# Patient Record
Sex: Male | Born: 1995 | Race: Black or African American | Hispanic: No | Marital: Single | Smoking: Never smoker
Health system: Southern US, Community
[De-identification: ages and names within clinical notes are randomized; demographics above are authoritative.]

## PROBLEM LIST (undated history)

## (undated) ENCOUNTER — Emergency Department (HOSPITAL_COMMUNITY): Admission: EM | Payer: Managed Care, Other (non HMO) | Source: Home / Self Care

## (undated) ENCOUNTER — Ambulatory Visit (HOSPITAL_COMMUNITY): Admission: EM | Discharge status: Absent without leave | Payer: Managed Care, Other (non HMO)

---

## 2014-08-18 ENCOUNTER — Encounter (HOSPITAL_COMMUNITY): Payer: Self-pay | Admitting: Emergency Medicine

## 2014-08-18 ENCOUNTER — Emergency Department (INDEPENDENT_AMBULATORY_CARE_PROVIDER_SITE_OTHER)
Admission: EM | Admit: 2014-08-18 | Discharge: 2014-08-18 | Disposition: A | Discharge status: Home / Self Care | Payer: Managed Care, Other (non HMO) | Source: Home / Self Care | Attending: Family Medicine | Admitting: Family Medicine

## 2014-08-18 DIAGNOSIS — F419 Anxiety disorder, unspecified: Secondary | ICD-10-CM

## 2014-08-18 DIAGNOSIS — F450 Somatization disorder: Secondary | ICD-10-CM | POA: Diagnosis not present

## 2014-08-18 DIAGNOSIS — F418 Other specified anxiety disorders: Secondary | ICD-10-CM | POA: Diagnosis not present

## 2014-08-18 MED ORDER — AMITRIPTYLINE HCL 25 MG PO TABS: 25.0000 mg | ORAL_TABLET | Freq: Every day | ORAL | Status: DC

## 2014-08-18 NOTE — ED Provider Notes (Signed)
CSN: [id number]     Arrival date & time 08/18/14  1928 History   First MD Initiated Contact with Patient 08/18/14 2006     Chief Complaint  Patient presents with  . 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 He states that he has poor concentration, difficult to stay on task and associated physical symptoms such as numbness in the feet and hands, perception of shortness of breath and rapid heart rate in a slow heart rate. Denies chest pain. Has no cardiopulmonary or cardiovascular history.   History reviewed. No pertinent past medical history. History reviewed. No pertinent past surgical history. History reviewed. No pertinent family history. History  Substance Use Topics  . Smoking status: Never Smoker   . Smokeless tobacco: Not on file  . Alcohol Use: No    Review of Systems  Constitutional: Positive for activity change. Negative for fever and fatigue.  HENT: Negative.   Respiratory: Negative for cough.   Cardiovascular: Positive for palpitations.  Gastrointestinal: Negative.   Genitourinary: Negative.   Skin: Negative.   Neurological: Positive for numbness. Negative for dizziness, seizures, facial asymmetry, speech difficulty and headaches.  Psychiatric/Behavioral: Positive for sleep disturbance and decreased concentration. Negative for suicidal ideas. The patient is nervous/anxious.   All other systems reviewed and are negative.   Allergies  Review of patient's allergies indicates no known allergies.  Home Medications   Prior to Admission medications   Medication Sig Start Date End Date Taking? Authorizing Provider  amitriptyline (ELAVIL) 25 MG tablet Take 1 tablet (25 mg total) by mouth at bedtime. 08/18/14   Hayden Rasmussenavid Veronda Gabor, NP   BP 132/88 mmHg  Pulse 80  Temp(Src) 98.9 F (37.2 C) (Oral)  Resp 14  SpO2 99% Physical Exam  Constitutional:  He is oriented to person, place, and time. He appears well-developed and well-nourished. No distress.  Eyes: EOM are normal. Pupils are equal, round, and reactive to light.  Neck: Normal range of motion. Neck supple.  Cardiovascular: Normal rate, regular rhythm, normal heart sounds and intact distal pulses.   Pulmonary/Chest: Effort normal and breath sounds normal.  Musculoskeletal: Normal range of motion. He exhibits no edema.  Neurological: He is alert and oriented to person, place, and time.  Skin: Skin is warm and dry.  Psychiatric: His speech is normal and behavior is normal. His mood appears anxious. Cognition and memory are normal. Cognition and memory are not impaired.  Nursing note and vitals reviewed.   ED Course  Procedures (including critical care time) Labs Review Labs Reviewed - No data to display  Imaging Review No results found.  EKG: Normal sinus rhythm. No ectopy. No ST-T changes. Normal axis. MDM   1. Situational anxiety   2. Anxiety with somatization    Amitriptyline 25 mg one half tablet daily at bedtime for the first 2-3 nights. Then if needed go up to 1 tablet daily at bedtime. Continue see the school counselor. If medication needs changing or additional medication is needed recommend seeing a primary care provider in the area. Patient was advised we would not be able to prescribe benzodiazepines or SSRIs. This requires close follow-up that we are unable to provide here. Patient states he understands.    Hayden Rasmussenavid Annelise Mccoy, NP 08/18/14 2053

## 2014-08-18 NOTE — ED Notes (Signed)
Reports feeling like heart is racing and at time moving slow.  Tingling in chest all over.  SOB.  Tightness in chest.  No pain.    Denies cardiac hx.

## 2014-10-11 ENCOUNTER — Emergency Department (INDEPENDENT_AMBULATORY_CARE_PROVIDER_SITE_OTHER)
Admission: EM | Admit: 2014-10-11 | Discharge: 2014-10-11 | Disposition: A | Discharge status: Nursing Home (Includes ICF) | Payer: Managed Care, Other (non HMO) | Source: Home / Self Care | Attending: Family Medicine | Admitting: Family Medicine

## 2014-10-11 ENCOUNTER — Other Ambulatory Visit (HOSPITAL_COMMUNITY)
Admission: RE | Admit: 2014-10-11 | Payer: Managed Care, Other (non HMO) | Source: Ambulatory Visit | Admitting: Family Medicine

## 2014-10-11 ENCOUNTER — Encounter (HOSPITAL_COMMUNITY): Payer: Self-pay | Admitting: Emergency Medicine

## 2014-10-11 ENCOUNTER — Other Ambulatory Visit (HOSPITAL_COMMUNITY)
Admission: RE | Admit: 2014-10-11 | Discharge: 2014-10-11 | Disposition: A | Discharge status: Home / Self Care | Payer: Managed Care, Other (non HMO) | Source: Ambulatory Visit | Attending: Family Medicine | Admitting: Family Medicine

## 2014-10-11 ENCOUNTER — Emergency Department (HOSPITAL_COMMUNITY)
Admission: EM | Admit: 2014-10-11 | Discharge: 2014-10-12 | Disposition: A | Discharge status: Home / Self Care | Payer: Managed Care, Other (non HMO) | Attending: Emergency Medicine | Admitting: Emergency Medicine

## 2014-10-11 ENCOUNTER — Encounter (HOSPITAL_COMMUNITY): Payer: Self-pay | Admitting: *Deleted

## 2014-10-11 DIAGNOSIS — R198 Other specified symptoms and signs involving the digestive system and abdomen: Secondary | ICD-10-CM | POA: Diagnosis not present

## 2014-10-11 DIAGNOSIS — Z113 Encounter for screening for infections with a predominantly sexual mode of transmission: Secondary | ICD-10-CM | POA: Insufficient documentation

## 2014-10-11 DIAGNOSIS — Z880 Allergy status to penicillin: Secondary | ICD-10-CM | POA: Insufficient documentation

## 2014-10-11 DIAGNOSIS — K6289 Other specified diseases of anus and rectum: Secondary | ICD-10-CM | POA: Diagnosis not present

## 2014-10-11 DIAGNOSIS — K611 Rectal abscess: Secondary | ICD-10-CM | POA: Diagnosis not present

## 2014-10-11 LAB — OCCULT BLOOD, POC DEVICE: Fecal Occult Bld: POSITIVE — AB

## 2014-10-11 NOTE — ED Provider Notes (Addendum)
CSN: [id number]     Arrival date & time 10/11/14  1943 History   First MD Initiated Contact with Patient 10/11/14 2030       Chief Complaint  Patient presents with  . Rectal Pain  . SEXUALLY TRANSMITTED DISEASE    testing   (Consider location/radiation/quality/duration/timing/severity/associated sxs/prior Treatment) HPI     19 year old male presents with 2 complaints. The first is a likely be screened for STDs. He gets tested every 3 months. He has sex with men only, he usually uses a condom but not all the time. Denies any dysuria, testicle pain, or penile discharge.  His second complaint is rectal pain. He is concerned that this may be due to constipation. For one week he has gradually increasing rectal pain. The pain is there all the time but he gets worse with having a bowel movement. He has had only small hard bowel movements for the past week. He occasionally gets some bleeding onto the toilet paper as well. He admits to some mild headache and some possible subjective fever and chills as well.  History reviewed. No pertinent past medical history. History reviewed. No pertinent past surgical history. History reviewed. No pertinent family history. History  Substance Use Topics  . Smoking status: Never Smoker   . Smokeless tobacco: Not on file  . Alcohol Use: No    Review of Systems  Constitutional: Positive for chills.  Gastrointestinal: Positive for constipation and rectal pain.  Genitourinary: Negative for dysuria, urgency, frequency and testicular pain.  All other systems reviewed and are negative.   Allergies  Review of patient's allergies indicates no known allergies.  Home Medications   Prior to Admission medications   Medication Sig Start Date End Date Taking? Authorizing Provider  amitriptyline (ELAVIL) 25 MG tablet Take 1 tablet (25 mg total) by mouth at bedtime. 08/18/14   Hayden Rasmussenavid Mabe, NP   BP 135/82 mmHg  Pulse 75  Temp(Src) 98.4 F (36.9 C) (Oral)  Resp 12   SpO2 98% Physical Exam  Constitutional: He is oriented to person, place, and time. He appears well-developed and well-nourished. No distress.  HENT:  Head: Normocephalic.  Pulmonary/Chest: Effort normal. No respiratory distress.  Abdominal: Soft. There is no tenderness.  Genitourinary: Rectal exam shows mass and tenderness (1 cm inside the anal verge on the posterior anus between the anus and the coccyx, there is a palpable fluctuant fullness that is exquisitely tender). Rectal exam shows no external hemorrhoid and no internal hemorrhoid. Guaiac positive stool.  Neurological: He is alert and oriented to person, place, and time. Coordination normal.  Skin: Skin is warm and dry. No rash noted. He is not diaphoretic.  Psychiatric: He has a normal mood and affect. Judgment normal.  Nursing note and vitals reviewed.   ED Course  Procedures (including critical care time) Labs Review Labs Reviewed  OCCULT BLOOD, POC DEVICE - Abnormal; Notable for the following:    Fecal Occult Bld POSITIVE (*)    All other components within normal limits  RPR  HIV ANTIBODY (ROUTINE TESTING)  URINE CYTOLOGY ANCILLARY ONLY    Imaging Review No results found.   MDM   1. Rectal pain   2. Rectal mass   3. Screen for STD (sexually transmitted disease)    I called the emergency department and spoke with one of the physician assistants therapy recommended we go ahead and send this patient over to get a CT scan to rule out an initial rectal abscess. He'll be transferred via shuttle.  He also requested STD screening, we have sent urine cytology for GC and chlamydia and trichomonas and also sent serum HIV and RPR.    Graylon Good, PA-C 10/11/14 2059   RPR reactive.  He has been called to come back in for treatment - 2.4 million units IM Bicillin   Graylon Good, PA-C 10/19/14 [phone]

## 2014-10-11 NOTE — ED Notes (Signed)
Pt will be sent to ED via shuttle for possible rectal abscess.  Report was called to Wilford Sportsassie, RN First Nurse ED.

## 2014-10-11 NOTE — ED Notes (Signed)
Pt reports anal pain for one week.  He thinks it is from straining from being constipated.  Pt would also like to be test for STD's.  He was last tested in January.  Pt says he does use condoms and has a male partner.

## 2014-10-11 NOTE — ED Notes (Signed)
Pt sent here from urgent care for evaluation of possible peri-rectal abscess, c/o pain to area for the last few days, no distress noted

## 2014-10-11 NOTE — ED Provider Notes (Signed)
CSN: [id number]     Arrival date & time 10/11/14  2107 History   First MD Initiated Contact with Patient 10/11/14 2301     Chief Complaint  Patient presents with  . Abscess     (Consider location/radiation/quality/duration/timing/severity/associated sxs/prior Treatment) Patient is a 19 y.o. male presenting with abscess. The history is provided by the patient. No language interpreter was used.  Abscess  [name] is a 19 y.o black male who presents for new onset worsening rectal pain for the past week.  He states nothing makes it better.  It is worse with bowel movements. His pain is 10/10. He states he is homosexual and last had anal intercourse 3 weeks ago.  At that time he states he had no pain. He has had some bright blood streaked stool. He denies any history of STD's.  He denies any fever, chills, nausea, vomiting, abdominal pain, dysuria, hematuria, or urinary frequency. History reviewed. No pertinent past medical history. History reviewed. No pertinent past surgical history. History reviewed. No pertinent family history. History  Substance Use Topics  . Smoking status: Never Smoker   . Smokeless tobacco: Not on file  . Alcohol Use: No    Review of Systems  Gastrointestinal: Negative for diarrhea and abdominal distention.      Allergies  Amoxicillin and Penicillins  Home Medications   Prior to Admission medications   Medication Sig Start Date End Date Taking? Authorizing Provider  cetirizine (ZYRTEC) 10 MG tablet Take 10 mg by mouth daily as needed for allergies.   Yes Historical Provider, MD  ibuprofen (ADVIL,MOTRIN) 200 MG tablet Take 200 mg by mouth every 6 (six) hours as needed for mild pain.   Yes Historical Provider, MD  amitriptyline (ELAVIL) 25 MG tablet Take 1 tablet (25 mg total) by mouth at bedtime. Patient not taking: Reported on 10/11/2014 08/18/14   Hayden Rasmussenavid Mabe, NP  ciprofloxacin (CIPRO) 500 MG tablet Take 1 tablet (500 mg total) by mouth 2 (two) times daily.  One po bid x 10 days 10/12/14   Elwin MochaBlair Walden, MD  HYDROcodone-acetaminophen (NORCO/VICODIN) 5-325 MG per tablet Take 1 tablet by mouth every 6 (six) hours as needed for moderate pain. 10/12/14   Elwin MochaBlair Walden, MD  metroNIDAZOLE (FLAGYL) 500 MG tablet Take 1 tablet (500 mg total) by mouth 3 (three) times daily. One po bid x 7 days 10/12/14   Elwin MochaBlair Walden, MD   BP 134/77 mmHg  Pulse 81  Temp(Src) 98.1 F (36.7 C) (Oral)  Resp 20  Ht 5\' 10"  (1.778 m)  Wt 175 lb (79.379 kg)  BMI 25.11 kg/m2  SpO2 99% Physical Exam  Constitutional: He is oriented to person, place, and time. He appears well-developed and well-nourished.  Eyes: Conjunctivae are normal.  Cardiovascular: Normal rate, regular rhythm and normal heart sounds.   Pulmonary/Chest: Effort normal and breath sounds normal.  Abdominal: Soft. He exhibits no distension. There is no tenderness.  Genitourinary:  Rectal exam: Chaperone present.  Patient tender on rectal exam. I could not feel fluctuance.  No blood, no hemorrhoids, and no anal fissure seen on exam.   Musculoskeletal: Normal range of motion.  Neurological: He is alert and oriented to person, place, and time.  Skin: Skin is warm and dry.  Nursing note and vitals reviewed.   ED Course  Procedures (including critical care time) Labs Review Labs Reviewed  CBC WITH DIFFERENTIAL/PLATELET - Abnormal; Notable for the following:    WBC 15.1 (*)    Neutrophils Relative % 80 (*)  Neutro Abs 12.0 (*)    All other components within normal limits  BASIC METABOLIC PANEL  POC OCCULT BLOOD, ED    Imaging Review Ct Abdomen Pelvis W Contrast  10/12/2014   CLINICAL DATA:  Anal pain for 1 week, concerning for abscess.  EXAM: CT ABDOMEN AND PELVIS WITH CONTRAST  TECHNIQUE: Multidetector CT imaging of the abdomen and pelvis was performed using the standard protocol following bolus administration of intravenous contrast.  CONTRAST:  100 cc Omnipaque 300 intravenous  COMPARISON:  None.   FINDINGS: BODY WALL: No contributory findings.  LOWER CHEST: No contributory findings.  ABDOMEN/PELVIS:  Liver: No focal abnormality.  Biliary: No evidence of biliary obstruction or stone.  Pancreas: Unremarkable.  Spleen: Unremarkable.  Adrenals: Unremarkable.  Kidneys and ureters: No hydronephrosis or stone.  Bladder: Unremarkable.  Reproductive: No pathologic findings.  Bowel: 11 mm peripherally enhancing fluid collection at the anal rectal junction. No visible fistula to the perineum.  Retroperitoneum: No mass or adenopathy.  Peritoneum: No ascites or pneumoperitoneum.  Vascular: No acute abnormality.  OSSEOUS: No acute abnormalities.  IMPRESSION: 11 mm posterior anorectal abscess.   Electronically Signed   By: Marnee Spring M.D.   On: 10/12/2014 03:18     EKG Interpretation None      MDM   Final diagnoses:  Perirectal abscess  Patient presents for rectal pain x 1 week. On exam he is tender on rectal exam but no fluctuance or bleeding.   He has a WBC of 15.1. Negative fecal occult.  Normal BMP. He is currently afebrile and his vitals are stable. I suspect the patient has a rectal abscess.  The CT results are pending.  I have discussed this patient with and transferred care of this patient to Dr. Elwin Mocha.       Catha Gosselin, PA-C 10/13/14 1545  Nelva Nay, MD 10/14/14 [phone]

## 2014-10-12 ENCOUNTER — Encounter (HOSPITAL_COMMUNITY): Payer: Self-pay

## 2014-10-12 ENCOUNTER — Emergency Department (HOSPITAL_COMMUNITY): Payer: Managed Care, Other (non HMO)

## 2014-10-12 LAB — CBC WITH DIFFERENTIAL/PLATELET
BASOS PCT: 0 % (ref 0–1)
Basophils Absolute: 0 10*3/uL (ref 0.0–0.1)
Eosinophils Absolute: 0.3 10*3/uL (ref 0.0–0.7)
Eosinophils Relative: 2 % (ref 0–5)
HEMATOCRIT: 43.1 % (ref 39.0–52.0)
Hemoglobin: 14.4 g/dL (ref 13.0–17.0)
LYMPHS PCT: 13 % (ref 12–46)
Lymphs Abs: 2 10*3/uL (ref 0.7–4.0)
MCH: 30.4 pg (ref 26.0–34.0)
MCHC: 33.4 g/dL (ref 30.0–36.0)
MCV: 91.1 fL (ref 78.0–100.0)
MONO ABS: 0.8 10*3/uL (ref 0.1–1.0)
MONOS PCT: 5 % (ref 3–12)
Neutro Abs: 12 10*3/uL — ABNORMAL HIGH (ref 1.7–7.7)
Neutrophils Relative %: 80 % — ABNORMAL HIGH (ref 43–77)
Platelets: 205 10*3/uL (ref 150–400)
RBC: 4.73 MIL/uL (ref 4.22–5.81)
RDW: 12.3 % (ref 11.5–15.5)
WBC: 15.1 10*3/uL — ABNORMAL HIGH (ref 4.0–10.5)

## 2014-10-12 LAB — BASIC METABOLIC PANEL
Anion gap: 6 (ref 5–15)
BUN: 6 mg/dL (ref 6–23)
CHLORIDE: 105 mmol/L (ref 96–112)
CO2: 29 mmol/L (ref 19–32)
Calcium: 9.4 mg/dL (ref 8.4–10.5)
Creatinine, Ser: 1.02 mg/dL (ref 0.50–1.35)
GFR calc Af Amer: >90 mL/min (ref >90)
GFR calc non Af Amer: >90 mL/min (ref >90)
GLUCOSE: 88 mg/dL (ref 70–99)
Potassium: 3.5 mmol/L (ref 3.5–5.1)
SODIUM: 140 mmol/L (ref 135–145)

## 2014-10-12 LAB — URINE CYTOLOGY ANCILLARY ONLY
Chlamydia: NEGATIVE
NEISSERIA GONORRHEA: NEGATIVE
Trichomonas: NEGATIVE

## 2014-10-12 LAB — HIV ANTIBODY (ROUTINE TESTING W REFLEX): HIV SCREEN 4TH GENERATION: NONREACTIVE

## 2014-10-12 LAB — POC OCCULT BLOOD, ED: Fecal Occult Bld: NEGATIVE

## 2014-10-12 MED ORDER — METRONIDAZOLE 500 MG PO TABS
500.0000 mg | ORAL_TABLET | Freq: Once | ORAL | Status: AC
  Administered 2014-10-12: 500 mg via ORAL
  Filled 2014-10-12: qty 1

## 2014-10-12 MED ORDER — METRONIDAZOLE 500 MG PO TABS: 500.0000 mg | ORAL_TABLET | Freq: Three times a day (TID) | ORAL | Status: DC

## 2014-10-12 MED ORDER — IOHEXOL 300 MG/ML  SOLN
25.0000 mL | Freq: Once | INTRAMUSCULAR | Status: AC | PRN
  Administered 2014-10-12: 25 mL via ORAL

## 2014-10-12 MED ORDER — IOHEXOL 300 MG/ML  SOLN
100.0000 mL | Freq: Once | INTRAMUSCULAR | Status: AC | PRN
  Administered 2014-10-12: 100 mL via INTRAVENOUS

## 2014-10-12 MED ORDER — MORPHINE SULFATE 4 MG/ML IJ SOLN
INTRAMUSCULAR | Status: AC
  Filled 2014-10-12: qty 1

## 2014-10-12 MED ORDER — CIPROFLOXACIN HCL 500 MG PO TABS: 500.0000 mg | ORAL_TABLET | Freq: Two times a day (BID) | ORAL | Status: DC

## 2014-10-12 MED ORDER — HYDROCODONE-ACETAMINOPHEN 5-325 MG PO TABS
2.0000 | ORAL_TABLET | Freq: Once | ORAL | Status: AC
Start: 2014-10-12 — End: 2014-10-12
  Administered 2014-10-12: 2 via ORAL
  Filled 2014-10-12: qty 2

## 2014-10-12 MED ORDER — HYDROCODONE-ACETAMINOPHEN 5-325 MG PO TABS: 1.0000 | ORAL_TABLET | Freq: Four times a day (QID) | ORAL | Status: DC | PRN

## 2014-10-12 MED ORDER — CIPROFLOXACIN HCL 500 MG PO TABS
500.0000 mg | ORAL_TABLET | Freq: Once | ORAL | Status: AC
  Administered 2014-10-12: 500 mg via ORAL
  Filled 2014-10-12: qty 1

## 2014-10-12 NOTE — Discharge Instructions (Signed)
Peri-Rectal Abscess  Your caregiver has diagnosed you as having a peri-rectal abscess. This is an infected area near the rectum that is filled with pus. If the abscess is near the surface of the skin, your caregiver may open (incise) the area and drain the pus.  HOME CARE INSTRUCTIONS    If your abscess was opened up and drained. A small piece of gauze may be placed in the opening so that it can drain. Do not remove the gauze unless directed by your caregiver.   A loose dressing may be placed over the abscess site. Change the dressing as often as necessary to keep it clean and dry.   After the drain is removed, the area may be washed with a gentle antiseptic (soap) four times per day.   A warm sitz bath, warm packs or heating pad may be used for pain relief, taking care not to burn yourself.   Return for a wound check in 1 day or as directed.   An "inflatable doughnut" may be used for sitting with added comfort. These can be purchased at a drugstore or medical supply house.   To reduce pain and straining with bowel movements, eat a high fiber diet with plenty of fruits and vegetables. Use stool softeners as recommended by your caregiver. This is especially important if narcotic type pain medications were prescribed as these may cause marked constipation.   Only take over-the-counter or prescription medicines for pain, discomfort, or fever as directed by your caregiver.  SEEK IMMEDIATE MEDICAL CARE IF:    You have increasing pain that is not controlled by medication.   There is increased inflammation (redness), swelling, bleeding, or drainage from the area.   An oral temperature above 102 F (38.9 C) develops.   You develop chills or generalized malaise (feel lethargic or feel "washed out").   You develop any new symptoms (problems) you feel may be related to your present problem.  Document Released: 06/14/2000 Document Revised: 09/09/2011 Document Reviewed: 06/14/2008  [employer] Patient Information  2015 [employer]. This information is not intended to replace advice given to you by your health care provider. Make sure you discuss any questions you have with your health care provider.

## 2014-10-12 NOTE — ED Notes (Signed)
Patient states understanding of discharge instructions. He requested to ambulate to the lobby. His gait was assessed and he was steady. First dose of ABT was provided prior to discharge. Pain medication was provided and it was insured that patient had other means of transportation.

## 2014-10-12 NOTE — Progress Notes (Signed)
19 yr old male seen by EDP and d/c on 10/12/14 with rx fro flagyl.  EDRN CM transferred a call with [facility] staff member, Revonda Standardllison on the line to get clarification of administration of Flagyl.  ED RN CM spoke with Dr Kirtland BouchardK Ward to review order Determined order should be Flagyl 1 tab bid for 7 days with toatl of 14 tabs, no refills start date 10/12/14  ED RN [facility]

## 2014-10-12 NOTE — ED Provider Notes (Signed)
[facility] care [facility]. Patient awaiting ET for possible perirectal abscess. CT returned, reviewed by me. Shows perirectal/perianal abscess. So 11 mm. I examined the patient could not see any erythema or fluctuance on the exterior. I did not perform an internal rectal exam due to discomfort for the patient. I spoke with Dr.Tsuei concerning the patient. He recommended sitz baths and antibiotics and return to the ED in 2 days if not improving. He said symptoms under 2 cm, should be able to get good control with antibiotics.  1. Perirectal abscess   2. Rectal pain   3. Rectal pain   4. Rectal pain   5. Rectal pain    I have reviewed all labs and imaging and considered them in my medical decision making.  CT Abdomen Pelvis W Contrast (Final result) Result time: 10/12/14 03:18:48   Final result by Rad Results In Interface (10/12/14 03:18:48)   Narrative:   CLINICAL DATA: Anal pain for 1 week, concerning for abscess.  EXAM: CT ABDOMEN AND PELVIS WITH CONTRAST  TECHNIQUE: Multidetector CT imaging of the abdomen and pelvis was performed using the standard protocol following bolus administration of intravenous contrast.  CONTRAST: 100 cc Omnipaque 300 intravenous  COMPARISON: None.  FINDINGS: BODY WALL: No contributory findings.  LOWER CHEST: No contributory findings.  ABDOMEN/PELVIS:  Liver: No focal abnormality.  Biliary: No evidence of biliary obstruction or stone.  Pancreas: Unremarkable.  Spleen: Unremarkable.  Adrenals: Unremarkable.  Kidneys and ureters: No hydronephrosis or stone.  Bladder: Unremarkable.  Reproductive: No pathologic findings.  Bowel: 11 mm peripherally enhancing fluid collection at the anal rectal junction. No visible fistula to the perineum.  Retroperitoneum: No mass or adenopathy.  Peritoneum: No ascites or pneumoperitoneum.  Vascular: No acute abnormality.  OSSEOUS: No acute abnormalities.  IMPRESSION: 11 mm posterior  anorectal abscess.   Electronically Signed By: Marnee SpringJonathon Watts M.D. On: 10/12/2014 03:18        Elwin MochaBlair Remon Quinto, MD 10/12/14 [id number]

## 2014-10-13 LAB — RPR, QUANT+TP ABS (REFLEX)
Rapid Plasma Reagin, Quant: 1:4 {titer} — ABNORMAL HIGH
TREPONEMA PALLIDUM AB: POSITIVE — AB

## 2014-10-13 LAB — RPR: RPR Ser Ql: REACTIVE — AB

## 2014-10-17 ENCOUNTER — Telehealth (HOSPITAL_COMMUNITY): Payer: Self-pay | Admitting: *Deleted

## 2014-10-17 NOTE — ED Notes (Addendum)
GC/Chlamydia and Trich neg., HIV non-reactive, RPR reactive, quant 1:4, T. Pallidum positive.  I called pt. Pt. verified x 2 and given results.  Pt. toldhe needs to come back tomorrow for treatment.  Pt. instructed to notify his partner,no sex for 1 week after treatment and to practice safe sex.  He [facility].  Pt. Voiced understanding. 10/17/2014 I called pt. back.  He said he went to the ED and got some pills. I asked what medicine and he said Metronidazole and Ciprofloxacin.  I told him that does not treat Syphilis.  I told him the treatment is 2 shots of Penicillin. He said he is allergic- it causes a rash.  Discussed with Dr. Piedad ClimesHonig. She said the alternative treatment is Doxycycline BID x 14 days or desensitization which we do not do here.   She said to call the Health Dept. to verify treatment options.  [facility].  Dr. Piedad ClimesHonig notified and e-prescribed it to pt.'s pharmacy. I called pt. back and notified of treatment plan. He said he is not good at taking pills and wants to do the other. I told him it would be more expensive and take longer because he would have to go to the ED...-explained the process to him.  He said he will try the pills. Pt. told where to pick up the Rx. Vassie MoselleYork, Sefora Tietje M 10/24/2014 DHHS form completed and [facility]. 10/24/2014

## 2014-10-24 MED ORDER — DOXYCYCLINE HYCLATE 100 MG PO CAPS
100.0000 mg | ORAL_CAPSULE | Freq: Two times a day (BID) | ORAL | Status: DC
Start: 2014-10-24 — End: 2015-07-12

## 2015-03-07 ENCOUNTER — Emergency Department (HOSPITAL_COMMUNITY)
Admission: EM | Admit: 2015-03-07 | Discharge: 2015-03-07 | Discharge status: Against Medical Advice | Payer: Managed Care, Other (non HMO)

## 2015-03-08 ENCOUNTER — Emergency Department (HOSPITAL_COMMUNITY)
Admission: EM | Admit: 2015-03-08 | Discharge: 2015-03-08 | Disposition: A | Discharge status: Home / Self Care | Payer: No Typology Code available for payment source | Attending: Emergency Medicine | Admitting: Emergency Medicine

## 2015-03-08 ENCOUNTER — Emergency Department (HOSPITAL_COMMUNITY): Payer: No Typology Code available for payment source

## 2015-03-08 ENCOUNTER — Encounter (HOSPITAL_COMMUNITY): Payer: Self-pay | Admitting: Emergency Medicine

## 2015-03-08 DIAGNOSIS — Z880 Allergy status to penicillin: Secondary | ICD-10-CM | POA: Diagnosis not present

## 2015-03-08 DIAGNOSIS — K611 Rectal abscess: Secondary | ICD-10-CM | POA: Insufficient documentation

## 2015-03-08 DIAGNOSIS — Z79899 Other long term (current) drug therapy: Secondary | ICD-10-CM | POA: Diagnosis not present

## 2015-03-08 DIAGNOSIS — Z792 Long term (current) use of antibiotics: Secondary | ICD-10-CM | POA: Insufficient documentation

## 2015-03-08 LAB — URINALYSIS, ROUTINE W REFLEX MICROSCOPIC
Bilirubin Urine: NEGATIVE
Glucose, UA: NEGATIVE mg/dL
HGB URINE DIPSTICK: NEGATIVE
KETONES UR: 15 mg/dL — AB
Leukocytes, UA: NEGATIVE
Nitrite: NEGATIVE
Protein, ur: NEGATIVE mg/dL
SPECIFIC GRAVITY, URINE: 1.015 (ref 1.005–1.030)
UROBILINOGEN UA: 1 mg/dL (ref 0.0–1.0)
pH: 7.5 (ref 5.0–8.0)

## 2015-03-08 LAB — CBC WITH DIFFERENTIAL/PLATELET
BASOS PCT: 0 % (ref 0–1)
Basophils Absolute: 0 10*3/uL (ref 0.0–0.1)
EOS ABS: 0.3 10*3/uL (ref 0.0–0.7)
EOS PCT: 2 % (ref 0–5)
HCT: 42.1 % (ref 39.0–52.0)
Hemoglobin: 13.6 g/dL (ref 13.0–17.0)
LYMPHS ABS: 1.8 10*3/uL (ref 0.7–4.0)
Lymphocytes Relative: 11 % — ABNORMAL LOW (ref 12–46)
MCH: 30.2 pg (ref 26.0–34.0)
MCHC: 32.3 g/dL (ref 30.0–36.0)
MCV: 93.6 fL (ref 78.0–100.0)
MONOS PCT: 4 % (ref 3–12)
Monocytes Absolute: 0.6 10*3/uL (ref 0.1–1.0)
Neutro Abs: 13.1 10*3/uL — ABNORMAL HIGH (ref 1.7–7.7)
Neutrophils Relative %: 83 % — ABNORMAL HIGH (ref 43–77)
PLATELETS: 186 10*3/uL (ref 150–400)
RBC: 4.5 MIL/uL (ref 4.22–5.81)
RDW: 12.6 % (ref 11.5–15.5)
WBC: 15.8 10*3/uL — ABNORMAL HIGH (ref 4.0–10.5)

## 2015-03-08 LAB — I-STAT CHEM 8, ED
BUN: 8 mg/dL (ref 6–20)
CALCIUM ION: 1.24 mmol/L — AB (ref 1.12–1.23)
Chloride: 104 mmol/L (ref 101–111)
Creatinine, Ser: 1 mg/dL (ref 0.61–1.24)
Glucose, Bld: 102 mg/dL — ABNORMAL HIGH (ref 65–99)
HCT: 45 % (ref 39.0–52.0)
HEMOGLOBIN: 15.3 g/dL (ref 13.0–17.0)
Potassium: 3.9 mmol/L (ref 3.5–5.1)
SODIUM: 142 mmol/L (ref 135–145)
TCO2: 26 mmol/L (ref 0–100)

## 2015-03-08 MED ORDER — CIPROFLOXACIN HCL 500 MG PO TABS: 500.0000 mg | ORAL_TABLET | Freq: Two times a day (BID) | ORAL | Status: DC

## 2015-03-08 MED ORDER — HYDROCODONE-ACETAMINOPHEN 5-325 MG PO TABS
2.0000 | ORAL_TABLET | Freq: Once | ORAL | Status: AC
  Administered 2015-03-08: 2 via ORAL
  Filled 2015-03-08: qty 2

## 2015-03-08 MED ORDER — METRONIDAZOLE 500 MG PO TABS
500.0000 mg | ORAL_TABLET | Freq: Once | ORAL | Status: AC
  Administered 2015-03-08: 500 mg via ORAL
  Filled 2015-03-08: qty 1

## 2015-03-08 MED ORDER — IOHEXOL 300 MG/ML  SOLN
50.0000 mL | Freq: Once | INTRAMUSCULAR | Status: DC | PRN
  Administered 2015-03-08: 50 mL via ORAL
  Filled 2015-03-08: qty 50

## 2015-03-08 MED ORDER — IOHEXOL 300 MG/ML  SOLN
100.0000 mL | Freq: Once | INTRAMUSCULAR | Status: AC | PRN
  Administered 2015-03-08: 100 mL via INTRAVENOUS

## 2015-03-08 MED ORDER — METRONIDAZOLE 500 MG PO TABS: 500.0000 mg | ORAL_TABLET | Freq: Two times a day (BID) | ORAL | Status: DC

## 2015-03-08 MED ORDER — CIPROFLOXACIN HCL 500 MG PO TABS
500.0000 mg | ORAL_TABLET | Freq: Once | ORAL | Status: AC
  Administered 2015-03-08: 500 mg via ORAL
  Filled 2015-03-08: qty 1

## 2015-03-08 MED ORDER — HYDROCODONE-ACETAMINOPHEN 5-325 MG PO TABS: 1.0000 | ORAL_TABLET | Freq: Four times a day (QID) | ORAL | Status: DC | PRN

## 2015-03-08 NOTE — ED Notes (Signed)
Presents with perirectal abscess, has had this issue before, noticed it getting worse 3 days ago.  VSS, states he had chills but is afebrile at triage.

## 2015-03-08 NOTE — Discharge Instructions (Signed)
Peri-Rectal Abscess  Your caregiver has diagnosed you as having a peri-rectal abscess. This is an infected area near the rectum that is filled with pus. If the abscess is near the surface of the skin, your caregiver may open (incise) the area and drain the pus.  HOME CARE INSTRUCTIONS    If your abscess was opened up and drained. A small piece of gauze may be placed in the opening so that it can drain. Do not remove the gauze unless directed by your caregiver.   A loose dressing may be placed over the abscess site. Change the dressing as often as necessary to keep it clean and dry.   After the drain is removed, the area may be washed with a gentle antiseptic (soap) four times per day.   A warm sitz bath, warm packs or heating pad may be used for pain relief, taking care not to burn yourself.   Return for a wound check in 1 day or as directed.   An "inflatable doughnut" may be used for sitting with added comfort. These can be purchased at a drugstore or medical supply house.   To reduce pain and straining with bowel movements, eat a high fiber diet with plenty of fruits and vegetables. Use stool softeners as recommended by your caregiver. This is especially important if narcotic type pain medications were prescribed as these may cause marked constipation.   Only take over-the-counter or prescription medicines for pain, discomfort, or fever as directed by your caregiver.  SEEK IMMEDIATE MEDICAL CARE IF:    You have increasing pain that is not controlled by medication.   There is increased inflammation (redness), swelling, bleeding, or drainage from the area.   An oral temperature above 102 F (38.9 C) develops.   You develop chills or generalized malaise (feel lethargic or feel "washed out").   You develop any new symptoms (problems) you feel may be related to your present problem.  Document Released: 06/14/2000 Document Revised: 09/09/2011 Document Reviewed: 06/14/2008  [employer] Patient Information  2015 [employer]. This information is not intended to replace advice given to you by your health care provider. Make sure you discuss any questions you have with your health care provider.

## 2015-03-08 NOTE — ED Provider Notes (Signed)
CSN: [id number]     Arrival date & time 03/08/15  1041 History   First MD Initiated Contact with Patient 03/08/15 1044     Chief Complaint  Patient presents with  . Abscess     (Consider location/radiation/quality/duration/timing/severity/associated sxs/prior Treatment) HPI  19 year old male presents for evaluation of rectal pain. Patient states that he was diagnosed with a perirectal abscess 5 months ago but it was too small for size and drainage and he was discharge with oral antibiotic. He has had increasing worsening pain to his rectum for the approximately 3 days. Pain worsening with bowel movement, walking and sitting. Pain medially improved using Tucks and Recti-Wipes.  He has not had a normal bowel movement for several days as a worsening his rectal pain. He reported having subjective chills and hot flash. Denies any bowel bladder changes, no rectal bleeding or rectal discharge. Reports some mild urinary hesitancy without urinary discomfort. Patient does admits that he is sexually active with same sex partners, and has had anal sex. He has had 2 different partners within the past 6 months. No history of UC or Crohn's disease and no history of HIV. Patient also denies any trauma. No complaints of testicular pain, or penile discharge.    History reviewed. No pertinent past medical history. History reviewed. No pertinent past surgical history. No family history on file. Social History  Substance Use Topics  . Smoking status: Never Smoker   . Smokeless tobacco: None  . Alcohol Use: No    Review of Systems  All other systems reviewed and are negative.     Allergies  Amoxicillin and Penicillins  Home Medications   Prior to Admission medications   Medication Sig Start Date End Date Taking? Authorizing Provider  amitriptyline (ELAVIL) 25 MG tablet Take 1 tablet (25 mg total) by mouth at bedtime. Patient not taking: Reported on 10/11/2014 08/18/14   Hayden Rasmussen, NP  cetirizine  (ZYRTEC) 10 MG tablet Take 10 mg by mouth daily as needed for allergies.    Historical Provider, MD  ciprofloxacin (CIPRO) 500 MG tablet Take 1 tablet (500 mg total) by mouth 2 (two) times daily. One po bid x 10 days 10/12/14   Elwin Mocha, MD  doxycycline (VIBRAMYCIN) 100 MG capsule Take 1 capsule (100 mg total) by mouth 2 (two) times daily. 10/24/14   Charm Rings, MD  HYDROcodone-acetaminophen (NORCO/VICODIN) 5-325 MG per tablet Take 1 tablet by mouth every 6 (six) hours as needed for moderate pain. 10/12/14   Elwin Mocha, MD  ibuprofen (ADVIL,MOTRIN) 200 MG tablet Take 200 mg by mouth every 6 (six) hours as needed for mild pain.    Historical Provider, MD  metroNIDAZOLE (FLAGYL) 500 MG tablet Take 1 tablet (500 mg total) by mouth 3 (three) times daily. One po bid x 7 days 10/12/14   Elwin Mocha, MD   BP 106/77 mmHg  Pulse 87  Temp(Src) 98.9 F (37.2 C) (Oral)  Resp 18  Ht 5\' 10"  (1.778 m)  Wt 170 lb (77.111 kg)  BMI 24.39 kg/m2  SpO2 98% Physical Exam  Constitutional: He appears well-developed and well-nourished. No distress.  Well appearing African-American male laying in bed in no acute distress.  HENT:  Head: Atraumatic.  Eyes: Conjunctivae are normal.  Neck: Neck supple.  Cardiovascular: Normal rate and regular rhythm.   Pulmonary/Chest: Effort normal and breath sounds normal.  Abdominal: Soft. There is no tenderness.  Genitourinary:  Chaperone present during exam. Normal external rectum without evidence of anal fissure  or obvious abscess appreciated. Significant discomfort with digital rectal exam and exam is limited due to discomfort. No obvious abscess or mass appreciated.  Neurological: He is alert.  Skin: No rash noted.  Psychiatric: He has a normal mood and affect.  Nursing note and vitals reviewed.   ED Course  Procedures (including critical care time)  Patient here with rectal pain concerning for possible perirectal abscess however examination is limited due to  discomfort. No significant abdominal pain. Patient is afebrile with stable normal vital sign. I'll plan to obtain pelvis CT scan for further evaluation. Care discussed with Dr. Gwendolyn Grant.  3:58 PM   Labs pelvis CT scan demonstrate a small perirectal abscess measuring 1 x 1 cm at the posterior aspects of his rectum. This is similar to prior abscess. I have consult to surgery who recommend antibiotic at this time and patient can follow outpt as needed. Otherwise he is stable for discharge. Return percussion discussed.   Labs Reviewed  CBC WITH DIFFERENTIAL/PLATELET - Abnormal; Notable for the following:    WBC 15.8 (*)    Neutrophils Relative % 83 (*)    Neutro Abs 13.1 (*)    Lymphocytes Relative 11 (*)    All other components within normal limits  URINALYSIS, ROUTINE W REFLEX MICROSCOPIC (NOT AT [facility]) - Abnormal; Notable for the following:    Ketones, ur 15 (*)    All other components within normal limits  I-STAT CHEM 8, ED - Abnormal; Notable for the following:    Glucose, Bld 102 (*)    Calcium, Ion 1.24 (*)    All other components within normal limits    Imaging Review Ct Pelvis W Contrast  03/08/2015   CLINICAL DATA:  Perirectal pain, rule out abscess.  EXAM: CT PELVIS WITH CONTRAST  TECHNIQUE: Multidetector CT imaging of the pelvis was performed using the standard protocol following the bolus administration of intravenous contrast.  CONTRAST:  OMNIPAQUE IOHEXOL 300 MG/ML  SOLN  COMPARISON:  CT abdomen pelvis 10/12/2014  FINDINGS: Again noted and similar to the prior CT is a perianal fluid collection. This shows mild peripheral enhancement and has irregular contours. This measures approximately 11 x 12 mm is located posterior to the anus. Surrounding soft tissues appear normal. Muscles are symmetric around the anus.  No bony abnormality. No free fluid in the pelvis. Sigmoid diverticulosis. Urinary bladder normal. Negative for mass or adenopathy in the pelvis.  IMPRESSION: 11 x 12 mm  rim enhancing fluid collection posterior to the anus similar to the prior study. This may represent a recurrent perirectal abscess if the patient has appropriate symptoms.   Electronically Signed   By: Marlan Palau M.D.   On: 03/08/2015 14:42   I have personally reviewed and evaluated these images and lab results as part of my medical decision-making.   EKG Interpretation None      MDM   Final diagnoses:  Perirectal abscess    BP 103/65 mmHg  Pulse 57  Temp(Src) 98.9 F (37.2 C) (Oral)  Resp 18  Ht  (1.778 m)  Wt 170 lb (77.111 kg)  BMI 24.39 kg/m2  SpO2 99%  I have reviewed nursing notes and vital signs. I personally viewed the imaging tests through PACS system and agrees with radiologist's intepretation I reviewed available ER/hospitalization records through the EMR     Fayrene Helper, PA-C 03/08/15 1601  Elwin Mocha, MD 03/08/15 [phone]

## 2015-03-08 NOTE — ED Notes (Signed)
Patient transported to CT 

## 2015-07-05 ENCOUNTER — Emergency Department (INDEPENDENT_AMBULATORY_CARE_PROVIDER_SITE_OTHER)
Admission: EM | Admit: 2015-07-05 | Discharge: 2015-07-05 | Disposition: A | Discharge status: Home / Self Care | Payer: Self-pay | Source: Home / Self Care | Attending: Family Medicine | Admitting: Family Medicine

## 2015-07-05 ENCOUNTER — Encounter (HOSPITAL_COMMUNITY): Payer: Self-pay | Admitting: *Deleted

## 2015-07-05 MED ORDER — METHOCARBAMOL 500 MG PO TABS: 500.0000 mg | ORAL_TABLET | Freq: Three times a day (TID) | ORAL | Status: DC

## 2015-07-05 MED ORDER — IBUPROFEN 800 MG PO TABS: 800.0000 mg | ORAL_TABLET | Freq: Three times a day (TID) | ORAL | Status: DC
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
## 2015-07-12 ENCOUNTER — Encounter (HOSPITAL_COMMUNITY): Payer: Self-pay | Admitting: Emergency Medicine

## 2015-07-12 ENCOUNTER — Emergency Department (INDEPENDENT_AMBULATORY_CARE_PROVIDER_SITE_OTHER)
Admission: EM | Admit: 2015-07-12 | Discharge: 2015-07-12 | Disposition: A | Discharge status: Home / Self Care | Payer: No Typology Code available for payment source | Source: Home / Self Care | Attending: Emergency Medicine | Admitting: Emergency Medicine

## 2015-07-12 ENCOUNTER — Other Ambulatory Visit (HOSPITAL_COMMUNITY)
Admission: RE | Admit: 2015-07-12 | Discharge: 2015-07-12 | Disposition: A | Discharge status: Home / Self Care | Payer: No Typology Code available for payment source | Source: Ambulatory Visit | Attending: Emergency Medicine | Admitting: Emergency Medicine

## 2015-07-12 DIAGNOSIS — K611 Rectal abscess: Secondary | ICD-10-CM

## 2015-07-12 DIAGNOSIS — Z113 Encounter for screening for infections with a predominantly sexual mode of transmission: Secondary | ICD-10-CM | POA: Insufficient documentation

## 2015-07-12 MED ORDER — DOXYCYCLINE HYCLATE 100 MG PO CAPS: 100.0000 mg | ORAL_CAPSULE | Freq: Two times a day (BID) | ORAL | Status: DC

## 2015-07-12 MED ORDER — HYDROCODONE-ACETAMINOPHEN 5-325 MG PO TABS: 1.0000 | ORAL_TABLET | Freq: Four times a day (QID) | ORAL | Status: DC | PRN

## 2015-07-12 NOTE — ED Provider Notes (Addendum)
CSN: [id number]     Arrival date & time 07/12/15  1825 History   First MD Initiated Contact with Patient 07/12/15 1946     Chief Complaint  Patient presents with  . Abscess   (Consider location/radiation/quality/duration/timing/severity/associated sxs/prior Treatment) HPI  Dale Carroll is a 20 year old man here for evaluation of abscess. Dale Carroll states the last 2 days Dale Carroll has had pain and swelling just to the right of his rectum. Dale Carroll denies any drainage. No fevers or chills. Dale Carroll states Dale Carroll had a rectal abscess a few months ago. Dale Carroll also requests STD testing.  History reviewed. No pertinent past medical history. History reviewed. No pertinent past surgical history. History reviewed. No pertinent family history. Social History  Substance Use Topics  . Smoking status: Never Smoker   . Smokeless tobacco: None  . Alcohol Use: No    Review of Systems As in history of present illness Allergies  Amoxicillin and Penicillins  Home Medications   Prior to Admission medications   Medication Sig Start Date End Date Taking? Authorizing Provider  amitriptyline (ELAVIL) 25 MG tablet Take 1 tablet (25 mg total) by mouth at bedtime. Patient not taking: Reported on 10/11/2014 08/18/14   Hayden Rasmussen, NP  ciprofloxacin (CIPRO) 500 MG tablet Take 1 tablet (500 mg total) by mouth 2 (two) times daily. 03/08/15   Fayrene Helper, PA-C  doxycycline (VIBRAMYCIN) 100 MG capsule Take 1 capsule (100 mg total) by mouth 2 (two) times daily. 07/12/15   Charm Rings, MD  HYDROcodone-acetaminophen (NORCO) 5-325 MG tablet Take 1 tablet by mouth every 6 (six) hours as needed for moderate pain. 07/12/15   Charm Rings, MD  ibuprofen (ADVIL,MOTRIN) 800 MG tablet Take 1 tablet (800 mg total) by mouth 3 (three) times daily. 07/05/15   Linna Hoff, MD  methocarbamol (ROBAXIN) 500 MG tablet Take 1 tablet (500 mg total) by mouth 3 (three) times daily. 07/05/15   Linna Hoff, MD  metroNIDAZOLE (FLAGYL) 500 MG tablet Take 1 tablet (500 mg total) by mouth 2  (two) times daily. 03/08/15   Fayrene Helper, PA-C   Meds Ordered and Administered this Visit  Medications - No data to display  BP 130/74 mmHg  Pulse 67  Temp(Src) 98.4 F (36.9 C) (Oral)  Resp 16  SpO2 100% No data found.   Physical Exam  Constitutional: Dale Carroll is oriented to person, place, and time. Dale Carroll appears well-developed and well-nourished. No distress.  Cardiovascular: Normal rate.   Pulmonary/Chest: Effort normal.  Genitourinary:     Neurological: Dale Carroll is alert and oriented to person, place, and time.    ED Course  .Marland KitchenIncision and Drainage Date/Time: 07/12/2015 8:12 PM Performed by: Charm Rings Authorized by: Charm Rings Consent: Verbal consent obtained. Risks and benefits: risks, benefits and alternatives were discussed Consent given by: patient Patient understanding: patient states understanding of the procedure being performed Patient identity confirmed: verbally with patient Time out: Immediately prior to procedure a "time out" was called to verify the correct patient, procedure, equipment, support staff and site/side marked as required. Type: abscess Body area: anogenital Location details: perianal Anesthesia: local infiltration Local anesthetic: lidocaine 2% without epinephrine Anesthetic total: 1 ml Scalpel size: 11 Incision type: single straight Incision depth: dermal Complexity: simple Drainage: purulent Drainage amount: moderate Wound treatment: wound left open Patient tolerance: Patient tolerated the procedure well with no immediate complications   (including critical care time)  Labs Review Labs Reviewed  HIV ANTIBODY (ROUTINE TESTING)  RPR  URINE CYTOLOGY ANCILLARY  ONLY    Imaging Review No results found.    MDM   1. Peri-rectal abscess    Wound care as in AVS. Doxycycline. Norco as needed for pain. STD testing collected and sent.    Charm RingsErin J Eular Panek, MD 07/12/15 2013   RPR came back positive with increase in titer indicating new  infection or treatment failure.  Left voicemail requesting that Dale Carroll call back to discuss lab results as it is unclear if Dale Carroll was treated in 09/2014.  Will need treatment with bicillin 2.714million units IM.  Charm RingsErin J Aslee Such, MD 07/18/15 [phone]

## 2015-07-12 NOTE — ED Notes (Signed)
[facility] a [facility] that has been there for 2 days.

## 2015-07-12 NOTE — Discharge Instructions (Signed)
We drained the abscess today. Please do sitz baths twice a day for the next 2-3 days. Take doxycycline twice a day for 10 days. Use the Norco as needed for severe pain. We will call you if any of your testing comes back positive. Follow-up as needed.

## 2015-07-13 LAB — HIV ANTIBODY (ROUTINE TESTING W REFLEX): HIV Screen 4th Generation wRfx: NONREACTIVE

## 2015-07-13 LAB — URINE CYTOLOGY ANCILLARY ONLY
CHLAMYDIA, DNA PROBE: NEGATIVE
Neisseria Gonorrhea: NEGATIVE
Trichomonas: NEGATIVE

## 2015-07-14 LAB — RPR, QUANT+TP ABS (REFLEX)
Rapid Plasma Reagin, Quant: 1:128 {titer} — ABNORMAL HIGH
TREPONEMA PALLIDUM AB: POSITIVE — AB

## 2015-07-14 LAB — RPR: RPR: REACTIVE — AB

## 2015-07-18 ENCOUNTER — Telehealth (HOSPITAL_COMMUNITY): Payer: Self-pay | Admitting: Emergency Medicine

## 2015-07-18 NOTE — ED Notes (Signed)
Called pt and notified him of recent lab results from visit 07/12/15 Pt ID'd properly... Reports he's feeling better Pt is Neg for Gon/Chlam/HIV/Trich.... RPR came back POSITIVE Pt has been adv he needs to come in for further tx. Per Dr. Piedad Climes... He needs Bicilin 2.4 million units IM States he will try to make this night but believes it might not be till the end of this week before he comes in Stressed to he must seek immediate treatment and that by law [facility] has been informed.  Adv pt to abstain from sexual intercourse Pt verb understanding.  Documentation Faxed to [facility].

## 2015-07-21 ENCOUNTER — Telehealth (HOSPITAL_COMMUNITY): Payer: Self-pay | Admitting: Emergency Medicine

## 2015-07-21 NOTE — ED Notes (Signed)
Pt came in for copy of lab results... Notified pt that he needs to be treated NOW.... Pt says he can't at the moment but he will come in tomorrow.  Notified pt that [facility] has been notified. It is really important for him to come in and be treated.

## 2015-07-24 ENCOUNTER — Telehealth (HOSPITAL_COMMUNITY): Payer: Self-pay | Admitting: Emergency Medicine

## 2015-07-24 ENCOUNTER — Emergency Department (HOSPITAL_COMMUNITY)
Admission: EM | Admit: 2015-07-24 | Discharge: 2015-07-24 | Discharge status: Absent without leave | Payer: No Typology Code available for payment source | Source: Home / Self Care | Attending: Family Medicine | Admitting: Family Medicine

## 2015-07-24 DIAGNOSIS — A539 Syphilis, unspecified: Secondary | ICD-10-CM

## 2015-07-24 NOTE — ED Provider Notes (Addendum)
CSN: [id number]     Arrival date & time 07/24/15  1416 History   First MD Initiated Contact with Patient 07/24/15 1424     No chief complaint on file.  (Consider location/radiation/quality/duration/timing/severity/associated sxs/prior Treatment) Patient is a 20 y.o. male presenting with STD exposure. The history is provided by the patient.  Exposure to STD This is a new problem. Episode onset: seen 07/12/15 for abscess, recent rpr test pos, given doxy for abscess, also alt rx for pen allergic syph  reatment.    No past medical history on file. No past surgical history on file. No family history on file. Social History  Substance Use Topics  . Smoking status: Never Smoker   . Smokeless tobacco: Not on file  . Alcohol Use: No    Review of Systems  Allergies  Amoxicillin and Penicillins  Home Medications   Prior to Admission medications   Medication Sig Start Date End Date Taking? Authorizing Provider  amitriptyline (ELAVIL) 25 MG tablet Take 1 tablet (25 mg total) by mouth at bedtime. Patient not taking: Reported on 10/11/2014 08/18/14   Hayden Rasmussen, NP  ciprofloxacin (CIPRO) 500 MG tablet Take 1 tablet (500 mg total) by mouth 2 (two) times daily. 03/08/15   Fayrene Helper, PA-C  doxycycline (VIBRAMYCIN) 100 MG capsule Take 1 capsule (100 mg total) by mouth 2 (two) times daily. 07/12/15   Charm Rings, MD  HYDROcodone-acetaminophen (NORCO) 5-325 MG tablet Take 1 tablet by mouth every 6 (six) hours as needed for moderate pain. 07/12/15   Charm Rings, MD  ibuprofen (ADVIL,MOTRIN) 800 MG tablet Take 1 tablet (800 mg total) by mouth 3 (three) times daily. 07/05/15   Linna Hoff, MD  methocarbamol (ROBAXIN) 500 MG tablet Take 1 tablet (500 mg total) by mouth 3 (three) times daily. 07/05/15   Linna Hoff, MD  metroNIDAZOLE (FLAGYL) 500 MG tablet Take 1 tablet (500 mg total) by mouth 2 (two) times daily. 03/08/15   Fayrene Helper, PA-C   Meds Ordered and Administered this Visit  Medications - No data  to display  There were no vitals taken for this visit. No data found.   Physical Exam  ED Course  Procedures (including critical care time)  Labs Review Labs Reviewed - No data to display  Imaging Review No results found.   Visual Acuity Review  Right Eye Distance:   Left Eye Distance:   Bilateral Distance:    Right Eye Near:   Left Eye Near:    Bilateral Near:         MDM   1. Syphilis in male    Pt reports taking all of meds, no further treatment needed.    Linna Hoff, MD 07/24/15 1431  Linna Hoff, MD 08/06/15 Elisha Ponder

## 2015-07-24 NOTE — ED Notes (Signed)
LM on pt's VM.... It is important for pt to call back.... Will f/u w/[facility].

## 2015-07-24 NOTE — ED Notes (Signed)
Pt is allergic to Amox/PCN.... Per Dr. Artis Flock, pt was adequately treated w/Doxycycline on visit 07/12/15... Notified [facility].

## 2015-07-28 ENCOUNTER — Telehealth (HOSPITAL_BASED_OUTPATIENT_CLINIC_OR_DEPARTMENT_OTHER): Payer: Self-pay | Admitting: Emergency Medicine

## 2015-07-28 ENCOUNTER — Telehealth (HOSPITAL_COMMUNITY): Payer: Self-pay | Admitting: Emergency Medicine

## 2015-07-28 NOTE — ED Notes (Signed)
Faxed updated form to [facility] w/date of treatment.. Pt treated w/Doxy  BID x10 days since pt is allergic to Amox/PCN

## 2016-10-07 ENCOUNTER — Ambulatory Visit (HOSPITAL_COMMUNITY)
Admission: EM | Admit: 2016-10-07 | Discharge: 2016-10-07 | Disposition: A | Discharge status: Home / Self Care | Payer: No Typology Code available for payment source | Attending: Family Medicine | Admitting: Family Medicine

## 2016-10-07 ENCOUNTER — Encounter (HOSPITAL_COMMUNITY): Payer: Self-pay | Admitting: Emergency Medicine

## 2016-10-07 DIAGNOSIS — K6289 Other specified diseases of anus and rectum: Secondary | ICD-10-CM | POA: Diagnosis not present

## 2016-10-07 MED ORDER — DOXYCYCLINE HYCLATE 100 MG PO TABS
100.0000 mg | ORAL_TABLET | Freq: Once | ORAL | Status: AC
  Administered 2016-10-07: 100 mg via ORAL

## 2016-10-07 MED ORDER — DOXYCYCLINE HYCLATE 100 MG PO TABS
ORAL_TABLET | ORAL | Status: AC
  Filled 2016-10-07: qty 1

## 2016-10-07 MED ORDER — DOXYCYCLINE HYCLATE 100 MG PO TABS: 100.0000 mg | ORAL_TABLET | Freq: Two times a day (BID) | ORAL | 0 refills | Status: DC

## 2016-10-07 MED ORDER — HYDROCODONE-ACETAMINOPHEN 5-325 MG PO TABS: 1.0000 | ORAL_TABLET | Freq: Four times a day (QID) | ORAL | 0 refills | Status: DC | PRN

## 2016-10-07 NOTE — ED Provider Notes (Addendum)
[facility]    CSN: [id number] Arrival date & time: 10/07/16  1930     History   Chief Complaint Chief Complaint  Patient presents with  . Rectal Pain    HPI Dale Carroll is a 21 y.o. male.   The patient presented to the [facility] with a complaint of rectal pain x 3 days. Patient has had a rectal abscess in the past which was drained with a needle aspiration. This feels similar. He says that he has pain unless he lies on his side.  Patient works in a [employer] development [employer].      History reviewed. No pertinent past medical history.  There are no active problems to display for this patient.   History reviewed. No pertinent surgical history.     Home Medications    Prior to Admission medications   Medication Sig Start Date End Date Taking? Authorizing Provider  doxycycline (VIBRA-TABS) 100 MG tablet Take 1 tablet (100 mg total) by mouth 2 (two) times daily. 10/07/16   Elvina Sidle, MD  HYDROcodone-acetaminophen (NORCO) 5-325 MG tablet Take 1 tablet by mouth every 6 (six) hours as needed for moderate pain. 10/07/16   Elvina Sidle, MD    Family History History reviewed. No pertinent family history.  Social History Social History  Substance Use Topics  . Smoking status: Never Smoker  . Smokeless tobacco: Not on file  . Alcohol use No     Allergies   Amoxicillin and Penicillins   Review of Systems Review of Systems  Constitutional: Negative.   Gastrointestinal: Positive for rectal pain. Negative for abdominal pain, anal bleeding and blood in stool.  All other systems reviewed and are negative.    Physical Exam Triage Vital Signs ED Triage Vitals  Enc Vitals Group     BP 10/07/16 1945 122/60     Pulse Rate 10/07/16 1945 83     Resp 10/07/16 1945 16     Temp 10/07/16 1945 98.4 F (36.9 C)     Temp Source 10/07/16 1945 Oral     SpO2 10/07/16 1945 100 %     Weight --      Height --      Head Circumference --      Peak Flow --      Pain Score 10/07/16  1944 7     Pain Loc --      Pain Edu? --      Excl. in GC? --    No data found.   Updated Vital Signs BP 122/60 (BP Location: Left Arm)   Pulse 83   Temp 98.4 F (36.9 C) (Oral)   Resp 16   SpO2 100%    Physical Exam  Constitutional: He appears well-developed and well-nourished.  HENT:  Right Ear: External ear normal.  Left Ear: External ear normal.  Mouth/Throat: Oropharynx is clear and moist.  Eyes: Conjunctivae are normal. Pupils are equal, round, and reactive to light.  Neck: Normal range of motion. Neck supple.  Pulmonary/Chest: Effort normal.  Abdominal:  Abdomen is soft and nontender I palpated the entire anal verge and found no tenderness or fluctuance.  Nursing note and vitals reviewed.    UC Treatments / Results  Labs (all labs ordered are listed, but only abnormal results are displayed) Labs Reviewed - No data to display  EKG  EKG Interpretation None       Radiology No results found.  Procedures Procedures (including critical care time)  Medications Ordered in UC Medications  doxycycline (VIBRA-TABS) tablet 100 mg (not administered)     Initial Impression / Assessment and Plan / UC Course  I have reviewed the triage vital signs and the nursing notes.  Pertinent labs & imaging results that were available during my care of the patient were reviewed by me and considered in my medical decision making (see chart for details).     Final Clinical Impressions(s) / UC Diagnoses   Final diagnoses:  Rectal pain    New Prescriptions New Prescriptions   DOXYCYCLINE (VIBRA-TABS) 100 MG TABLET    Take 1 tablet (100 mg total) by mouth 2 (two) times daily.   HYDROCODONE-ACETAMINOPHEN (NORCO) 5-325 MG TABLET    Take 1 tablet by mouth every 6 (six) hours as needed for moderate pain.     Elvina Sidle, MD 10/07/16 1610    Elvina Sidle, MD 10/07/16 [phone]

## 2016-10-07 NOTE — Discharge Instructions (Signed)
Continue the cream you've been using. Start doxycycline as soon as possible and take twice a day. If the pain worsens tomorrow please return. If the pain has not changed by Wednesday, please return.

## 2016-10-07 NOTE — ED Triage Notes (Signed)
The patient presented to the [facility] with a complaint of rectal pain x 3 days.

## 2016-10-08 ENCOUNTER — Emergency Department (HOSPITAL_COMMUNITY): Payer: Managed Care, Other (non HMO)

## 2016-10-08 ENCOUNTER — Emergency Department (HOSPITAL_COMMUNITY)
Admission: EM | Admit: 2016-10-08 | Discharge: 2016-10-08 | Disposition: A | Discharge status: Home / Self Care | Payer: Managed Care, Other (non HMO) | Attending: Emergency Medicine | Admitting: Emergency Medicine

## 2016-10-08 ENCOUNTER — Ambulatory Visit (HOSPITAL_COMMUNITY)
Admission: EM | Admit: 2016-10-08 | Discharge: 2016-10-08 | Disposition: A | Discharge status: Home / Self Care | Payer: Managed Care, Other (non HMO) | Attending: Internal Medicine | Admitting: Internal Medicine

## 2016-10-08 ENCOUNTER — Encounter (HOSPITAL_COMMUNITY): Payer: Self-pay | Admitting: Emergency Medicine

## 2016-10-08 ENCOUNTER — Encounter (HOSPITAL_COMMUNITY): Payer: Self-pay

## 2016-10-08 DIAGNOSIS — K611 Rectal abscess: Secondary | ICD-10-CM

## 2016-10-08 DIAGNOSIS — Z79899 Other long term (current) drug therapy: Secondary | ICD-10-CM | POA: Diagnosis not present

## 2016-10-08 MED ORDER — IOPAMIDOL (ISOVUE-300) INJECTION 61%
INTRAVENOUS | Status: AC
  Administered 2016-10-08: 100 mL
  Filled 2016-10-08: qty 100

## 2016-10-08 MED ORDER — LIDOCAINE HCL 2 % IJ SOLN
10.0000 mL | Freq: Once | INTRAMUSCULAR | Status: AC
  Administered 2016-10-08: 200 mg via INTRADERMAL
  Filled 2016-10-08: qty 20

## 2016-10-08 MED ORDER — HYDROCODONE-ACETAMINOPHEN 5-325 MG PO TABS
1.0000 | ORAL_TABLET | Freq: Once | ORAL | Status: AC
  Administered 2016-10-08: 1 via ORAL
  Filled 2016-10-08: qty 1

## 2016-10-08 MED ORDER — LIDOCAINE HCL 2 % IJ SOLN
10.0000 mL | Freq: Once | INTRAMUSCULAR | Status: AC
  Administered 2016-10-08: 200 mg
  Filled 2016-10-08: qty 20

## 2016-10-08 NOTE — ED Notes (Signed)
ED Provider at bedside. 

## 2016-10-08 NOTE — ED Notes (Signed)
Patient discharged to ED, RN at [facility] called. [facility] to see patient in ED.

## 2016-10-08 NOTE — Discharge Instructions (Signed)
Please go to Emergency Room.  On call Surgeon will see you for this condition.

## 2016-10-08 NOTE — ED Triage Notes (Signed)
Pt here from urgent care to see [facility] for diagnosed perianal abscess that has not improved.

## 2016-10-08 NOTE — ED Triage Notes (Signed)
Pt here for evaluation of abscess to the buttocks. Pt states he was seen at urgent care yesterday and given antibiotics. Pt states he has pain when using the bathroom, denies blood.

## 2016-10-08 NOTE — ED Notes (Signed)
Pt transporting to CT NAD.  

## 2016-10-08 NOTE — ED Triage Notes (Signed)
The patient presented to the [facility] with a complaint of pain after having an abscess in his rectum drained in the ED earlier this am.

## 2016-10-08 NOTE — ED Triage Notes (Addendum)
Pt presents to the ed with complaints of having an perianal abscess that was drained here today at 0600, he states that the pain and pressure is coming back and he still cannot have a bowel movement.  He went to urgent care and they told him to come here to possibly see a Careers adviser. Pt reports having chills and no appetite throughout the day

## 2016-10-08 NOTE — ED Provider Notes (Signed)
[facility]   CSN: [id number] Arrival date & time: 10/08/16  1743   By signing my name below, I, Clovis Pu, attest that this documentation has been prepared under the direction and in the presence of Nira Conn, MD  Electronically Signed: Clovis Pu, ED Scribe. 10/08/16. 7:47 PM.   History   Chief Complaint Chief Complaint  Patient presents with  . Abscess    HPI Comments:  Dale Carroll is a 21 y.o. male who presents to the Emergency Department complaining of constant, moderate rectal pain x today. His pain is worse with palpation. He states he went back to urgent care after leaving the ED because his pain returned and because he is unable to have a bowel movement. Per chart review, the pt was seen earlier today, was told he has a perirectal abscess and had an incision and drainage performed. He has been taking Doxycycline which was prescribed to him on 10/07/16 at his initial Urgent Care visit. Pt denies any other associated symptoms. No other complaints noted.   The history is provided by the patient. No language interpreter was used.    History reviewed. No pertinent past medical history.  There are no active problems to display for this patient.   History reviewed. No pertinent surgical history.   Home Medications    Prior to Admission medications   Medication Sig Start Date End Date Taking? Authorizing Provider  doxycycline (VIBRA-TABS) 100 MG tablet Take 1 tablet (100 mg total) by mouth 2 (two) times daily. 10/07/16   Elvina Sidle, MD  HYDROcodone-acetaminophen (NORCO) 5-325 MG tablet Take 1 tablet by mouth every 6 (six) hours as needed for moderate pain. 10/07/16   Elvina Sidle, MD    Family History No family history on file.  Social History Social History  Substance Use Topics  . Smoking status: Never Smoker  . Smokeless tobacco: Never Used  . Alcohol use No     Allergies   Amoxicillin   Review of Systems Review  of Systems All other systems reviewed and are negative for acute change except as noted in the HPI.  Physical Exam Updated Vital Signs BP 130/72   Pulse 76   Temp 98 F (36.7 C) (Oral)   Resp 18   Ht  (1.778 m)   Wt 190 lb (86.2 kg)   SpO2 100%   BMI 27.26 kg/m   Physical Exam  Constitutional: He is oriented to person, place, and time. He appears well-developed and well-nourished. No distress.  HENT:  Head: Normocephalic and atraumatic.  Nose: Nose normal.  Eyes: Conjunctivae and EOM are normal. Pupils are equal, round, and reactive to light. Right eye exhibits no discharge. Left eye exhibits no discharge. No scleral icterus.  Neck: Normal range of motion. Neck supple.  Cardiovascular: Normal rate and regular rhythm.  Exam reveals no gallop and no friction rub.   No murmur heard. Pulmonary/Chest: Effort normal and breath sounds normal. No stridor. No respiratory distress. He has no rales.  Abdominal: Soft. He exhibits no distension. There is no tenderness.  Genitourinary:     Genitourinary Comments: No incision noted.  Musculoskeletal: He exhibits no edema or tenderness.  Neurological: He is alert and oriented to person, place, and time.  Skin: Skin is warm and dry. No rash noted. He is not diaphoretic. No erythema.  Psychiatric: He has a normal mood and affect.  Vitals reviewed.    ED Treatments / Results  DIAGNOSTIC STUDIES:  Oxygen Saturation is  100% on RA, normal by my interpretation.    COORDINATION OF CARE:  7:38 PM Discussed treatment plan with pt at bedside and pt agreed to plan.  Labs (all labs ordered are listed, but only abnormal results are displayed) Labs Reviewed - No data to display  EKG  EKG Interpretation None       Radiology Ct Pelvis W Contrast  Result Date: 10/08/2016 CLINICAL DATA:  Rectal abscess.  Rectal pain. EXAM: CT PELVIS WITH CONTRAST TECHNIQUE: Multidetector CT imaging of the pelvis was performed using the standard  protocol following the bolus administration of intravenous contrast. CONTRAST:  ISOVUE-300 IOPAMIDOL (ISOVUE-300) INJECTION 61% COMPARISON:  03/08/2015 FINDINGS: Urinary Tract: Distal ureters are decompressed. Urinary bladder unremarkable. Bowel: Fluid collection noted posterior to the rectum measuring approximately 2.4 x 1.7 cm compatible with perirectal abscess. Visualized large and small bowel otherwise unremarkable. Vascular/Lymphatic: Iliac vessels are normal caliber. No adenopathy. Reproductive:  No visible focal abnormality. Other:  No free fluid or free air. Musculoskeletal: No acute bony abnormality. IMPRESSION: Perirectal fluid collection measuring 2.4 x 1.7 cm compatible with perirectal abscess. Electronically Signed   By: Charlett Nose M.D.   On: 10/08/2016 09:12    Procedures .Marland KitchenIncision and Drainage Date/Time: 10/08/2016 9:20 PM Performed by: Nira Conn Authorized by: Nira Conn   Consent:    Consent obtained:  Verbal   Consent given by:  Patient Location:    Type:  Abscess   Size:  2 x 3 cm   Location:  Anogenital   Anogenital location:  Perirectal Pre-procedure details:    Skin preparation:  Betadine Anesthesia (see MAR for exact dosages):    Anesthesia method:  Local infiltration   Local anesthetic:  Lidocaine 1% w/o epi Procedure type:    Complexity:  Complex Procedure details:    Incision types:  Cruciate   Scalpel blade:  11   Wound management:  Probed and deloculated   Drainage:  Bloody and purulent   Drainage amount:  Moderate   Wound treatment:  Wound left open   Packing materials:  1/4 in iodoform gauze Post-procedure details:    Patient tolerance of procedure:  Tolerated well, no immediate complications   (including critical care time)  EMERGENCY DEPARTMENT US SOFT TISSUE INTERPRETATION "Study: Limited Soft Tissue Ultrasound"  INDICATIONS: Soft tissue infection Multiple views of the body part were obtained in real-time with  a multi-frequency linear probe  PERFORMED BY: Myself IMAGES ARCHIVED?: Yes SIDE:Right  BODY PART:perinum INTERPRETATION:  Abcess present and No cellulitis noted    Medications Ordered in ED Medications  HYDROcodone-acetaminophen (NORCO/VICODIN) 5-325 MG per tablet 1 tablet (1 tablet Oral Given 10/08/16 1947)  lidocaine (XYLOCAINE) 2 % (with pres) injection 200 mg (200 mg Intradermal Given 10/08/16 1947)     Initial Impression / Assessment and Plan / ED Course  I have reviewed the triage vital signs and the nursing notes.  Pertinent labs & imaging results that were available during my care of the patient were reviewed by me and considered in my medical decision making (see chart for details).     Posterior perirectal abscess noted on [place] and prior CT from today. I&D as above. Continue Doxy. Spoke with Dr. Sheliah Hatch who agreed to follow up in clinic in 2 days. Will provide EGS contact number for follow up. If pt is unable to be seen in surgery clinic, he was instructed to come to the ED for wound check.  On return visit to the ED, if they are  unable to see surgery, please perform bedside US to assess for appropriate drainage. If abscess still present please consult surgery for I&D.  The patient is safe for discharge with strict return precautions.   Final Clinical Impressions(s) / ED Diagnoses   Final diagnoses:  Perirectal abscess   Disposition: Discharge  Condition: Good  I have discussed the results, Dx and Tx plan with the patient who expressed understanding and agree(s) with the plan. Discharge instructions discussed at great length. The patient was given strict return precautions who verbalized understanding of the instructions. No further questions at time of discharge.    Current Discharge Medication List      Follow Up: Rodman Pickle, MD [address] [place] [address] [place] [address] [phone]  Call in 1 day to follow up 2 days from today for wound  check and appropriate abscess drainage.   I personally performed the services described in this documentation, which was scribed in my presence. The recorded information has been reviewed and is accurate.        Nira Conn, MD 10/08/16 2134

## 2016-10-08 NOTE — ED Notes (Signed)
Pt returned from CT °

## 2016-10-08 NOTE — Discharge Instructions (Signed)
Continue to doxycycline if you continue to have symptoms follow up

## 2016-10-08 NOTE — ED Provider Notes (Signed)
CSN: [id number]     Arrival date & time 10/08/16  1559 History   None    Chief Complaint  Patient presents with  . Abscess   (Consider location/radiation/quality/duration/timing/severity/associated sxs/prior Treatment) Patient presents with complaint of severe rectal pain.  He has perirectal abscess.  He was seen yesterday and rx'd doxycycline and hydrocodone by the Urgent Care.  He took a hydrocodone last pm and it didn't help.  He went to ED this am and they did I&D with scant drainage.  Patient states after the local anesthesia wore off the pain was worse and he has difficulty walking or having BM.   The history is provided by the patient.  Abscess  Abscess location: Rectum. Abscess quality: painful   Red streaking: no   Duration:  24 hours Progression:  Worsening Pain details:    Quality:  Throbbing   Severity:  Severe   Duration:  2 days   Timing:  Constant Chronicity:  New Relieved by:  Nothing Worsened by:  Nothing Ineffective treatments:  None tried   History reviewed. No pertinent past medical history. History reviewed. No pertinent surgical history. History reviewed. No pertinent family history. Social History  Substance Use Topics  . Smoking status: Never Smoker  . Smokeless tobacco: Never Used  . Alcohol use No    Review of Systems  Constitutional: Negative.   HENT: Negative.   Eyes: Negative.   Respiratory: Negative.   Cardiovascular: Negative.   Gastrointestinal: Positive for rectal pain.  Endocrine: Negative.   Genitourinary: Negative.   Musculoskeletal: Negative.   Skin: Negative.   Allergic/Immunologic: Negative.   Neurological: Negative.   Hematological: Negative.   Psychiatric/Behavioral: Negative.     Allergies  Amoxicillin and Penicillins  Home Medications   Prior to Admission medications   Medication Sig Start Date End Date Taking? Authorizing Provider  doxycycline (VIBRA-TABS) 100 MG tablet Take 1 tablet (100 mg total) by mouth 2  (two) times daily. 10/07/16  Yes Elvina Sidle, MD  HYDROcodone-acetaminophen (NORCO) 5-325 MG tablet Take 1 tablet by mouth every 6 (six) hours as needed for moderate pain. 10/07/16  Yes Elvina Sidle, MD   Meds Ordered and Administered this Visit  Medications - No data to display  BP 125/70 (BP Location: Right Arm)   Pulse 69   Temp 98.2 F (36.8 C) (Oral)   Resp 16   SpO2 96%  No data found.   Physical Exam  Constitutional: He is oriented to person, place, and time. He appears well-developed and well-nourished.  HENT:  Head: Normocephalic and atraumatic.  Eyes: Conjunctivae and EOM are normal. Pupils are equal, round, and reactive to light.  Neck: Normal range of motion. Neck supple.  Cardiovascular: Normal rate, regular rhythm and normal heart sounds.   Pulmonary/Chest: Effort normal and breath sounds normal.  Abdominal: Soft.  Genitourinary:  Genitourinary Comments: Patient with tenderness and pain with digital rectal exam. Swelling along bottom right of rectum.  Neurological: He is alert and oriented to person, place, and time.  Nursing note and vitals reviewed.   Urgent Care Course     Procedures (including critical care time)  Labs Review Labs Reviewed - No data to display  Imaging Review Ct Pelvis W Contrast  Result Date: 10/08/2016 CLINICAL DATA:  Rectal abscess.  Rectal pain. EXAM: CT PELVIS WITH CONTRAST TECHNIQUE: Multidetector CT imaging of the pelvis was performed using the standard protocol following the bolus administration of intravenous contrast. CONTRAST:  ISOVUE-300 IOPAMIDOL (ISOVUE-300) INJECTION 61% COMPARISON:  03/08/2015 FINDINGS: Urinary Tract: Distal ureters are decompressed. Urinary bladder unremarkable. Bowel: Fluid collection noted posterior to the rectum measuring approximately 2.4 x 1.7 cm compatible with perirectal abscess. Visualized large and small bowel otherwise unremarkable. Vascular/Lymphatic: Iliac vessels are normal caliber. No  adenopathy. Reproductive:  No visible focal abnormality. Other:  No free fluid or free air. Musculoskeletal: No acute bony abnormality. IMPRESSION: Perirectal fluid collection measuring 2.4 x 1.7 cm compatible with perirectal abscess. Electronically Signed   By: Charlett Nose M.D.   On: 10/08/2016 09:12     Visual Acuity Review  Right Eye Distance:   Left Eye Distance:   Bilateral Distance:    Right Eye Near:   Left Eye Near:    Bilateral Near:         MDM   1. Perirectal abscess    Dr. Sheliah Hatch called and notified and advised to send patient to Emergency Room and he will see patient there.  DC and take patient to ED via shuttle.      Deatra Canter, FNP 10/08/16 1728

## 2016-10-08 NOTE — ED Provider Notes (Signed)
[facility]   CSN: [id number] Arrival date & time: 10/08/16  2952     History   Chief Complaint Chief Complaint  Patient presents with  . Abscess    HPI Dale Carroll is a 21 y.o. male.  Pt comes in with c/o rectal pain. He state that he has been hurting for a couple of days. He was seen at urgent care yesterday and they gave him doxycycline and told him to follow up as needed. He is having constant pain. Pt has a history of similar symptoms. No fever.      History reviewed. No pertinent past medical history.  There are no active problems to display for this patient.   History reviewed. No pertinent surgical history.     Home Medications    Prior to Admission medications   Medication Sig Start Date End Date Taking? Authorizing Provider  doxycycline (VIBRA-TABS) 100 MG tablet Take 1 tablet (100 mg total) by mouth 2 (two) times daily. 10/07/16   Elvina Sidle, MD  HYDROcodone-acetaminophen (NORCO) 5-325 MG tablet Take 1 tablet by mouth every 6 (six) hours as needed for moderate pain. 10/07/16   Elvina Sidle, MD    Family History No family history on file.  Social History Social History  Substance Use Topics  . Smoking status: Never Smoker  . Smokeless tobacco: Never Used  . Alcohol use No     Allergies   Amoxicillin and Penicillins   Review of Systems Review of Systems  All other systems reviewed and are negative.    Physical Exam Updated Vital Signs BP 125/83 (BP Location: Left Arm)   Pulse 69   Temp 98.4 F (36.9 C) (Oral)   Resp 16   Ht  (1.778 m)   Wt 87.1 kg   SpO2 98%   BMI 27.55 kg/m   Physical Exam  Constitutional: He is oriented to person, place, and time. He appears well-developed and well-nourished.  Cardiovascular: Normal rate.   Pulmonary/Chest: Effort normal and breath sounds normal.  Abdominal: Soft. Bowel sounds are normal.  Genitourinary:  Genitourinary Comments: Fullness noted to the rectum  on the left side. No redness noted.   Musculoskeletal: Normal range of motion.  Neurological: He is alert and oriented to person, place, and time.  Nursing note and vitals reviewed.    ED Treatments / Results  Labs (all labs ordered are listed, but only abnormal results are displayed) Labs Reviewed - No data to display  EKG  EKG Interpretation None       Radiology No results found.  Procedures .Marland KitchenIncision and Drainage Date/Time: 10/08/2016 10:10 AM Performed by: Teressa Lower Authorized by: Teressa Lower   Consent:    Consent obtained:  Verbal   Consent given by:  Patient   Risks discussed:  Bleeding, incomplete drainage, pain and infection   Alternatives discussed:  Observation Location:    Type:  Abscess   Location:  Anogenital   Anogenital location:  Perirectal Pre-procedure details:    Skin preparation:  Betadine Anesthesia (see MAR for exact dosages):    Anesthesia method:  Local infiltration   Local anesthetic:  Lidocaine 1% w/o epi Procedure type:    Complexity:  Complex Procedure details:    Needle aspiration: no     Incision types:  Stab incision   Incision depth:  Dermal   Scalpel blade:  11   Drainage:  Purulent   Drainage amount:  Scant   Wound treatment:  Wound left open  Packing materials:  None Post-procedure details:    Patient tolerance of procedure:  Tolerated well, no immediate complications   (including critical care time)  Medications Ordered in ED Medications - No data to display   Initial Impression / Assessment and Plan / ED Course  I have reviewed the triage vital signs and the nursing notes.  Pertinent labs & imaging results that were available during my care of the patient were reviewed by me and considered in my medical decision making (see chart for details).  Clinical Course as of Oct 09 915  Tue Oct 08, 2016  0916 CT PELVIS W CONTRAST [VP]  1610 CT PELVIS W CONTRAST [VP]  9604 CT PELVIS W CONTRAST [VP]      Clinical Course User Index [VP] Teressa Lower, NP   Drained. Pt to follow up for continue symptoms  Final Clinical Impressions(s) / ED Diagnoses   Final diagnoses:  Rectal abscess    New Prescriptions New Prescriptions   No medications on file     Teressa Lower, NP 10/08/16 1012    Cathren Laine, MD 10/08/16 1029

## 2016-10-09 ENCOUNTER — Emergency Department (HOSPITAL_COMMUNITY)
Admission: EM | Admit: 2016-10-09 | Discharge: 2016-10-09 | Disposition: A | Discharge status: Home / Self Care | Payer: Managed Care, Other (non HMO) | Attending: Emergency Medicine | Admitting: Emergency Medicine

## 2016-10-09 ENCOUNTER — Encounter (HOSPITAL_COMMUNITY): Payer: Self-pay

## 2016-10-09 DIAGNOSIS — K610 Anal abscess: Secondary | ICD-10-CM | POA: Diagnosis not present

## 2016-10-09 DIAGNOSIS — Z79899 Other long term (current) drug therapy: Secondary | ICD-10-CM | POA: Insufficient documentation

## 2016-10-09 MED ORDER — OXYCODONE-ACETAMINOPHEN 5-325 MG PO TABS: 1.0000 | ORAL_TABLET | ORAL | 0 refills | Status: DC | PRN

## 2016-10-09 MED ORDER — DOCUSATE SODIUM 100 MG PO CAPS: 100.0000 mg | ORAL_CAPSULE | Freq: Two times a day (BID) | ORAL | 0 refills | Status: DC

## 2016-10-09 MED ORDER — OXYCODONE-ACETAMINOPHEN 5-325 MG PO TABS
2.0000 | ORAL_TABLET | Freq: Once | ORAL | Status: AC
  Administered 2016-10-09: 2 via ORAL
  Filled 2016-10-09: qty 2

## 2016-10-09 NOTE — ED Provider Notes (Signed)
[facility]EMERGENCY DEPT Provider Note   CSN: [id number] Arrival date & time: 10/09/16  0359     History   Chief Complaint Chief Complaint  Patient presents with  . Abscess    HPI Dale Carroll is a 21 y.o. male.  HPI Patient presents emergency department complaining of perianal pain.  He was seen approximate 6 hours ago in the emergency department and found to have a perirectal abscess on CT.  This was drained at the bedside with instructions to follow-up with general surgery as an outpatient.  His wound was packed.  The case had been discussed with general surgery.  He presents back to the emergency department complaining of ongoing discomfort and pain around his perianal area.  Still continue to have some drainage from his abscess I and D site.  His packing remains in place.  He is currently on doxycycline.  This pain is moderate in severity.  He's currently on hydrocodone for pain.         History reviewed. No pertinent past medical history.  There are no active problems to display for this patient.   History reviewed. No pertinent surgical history.     Home Medications    Prior to Admission medications   Medication Sig Start Date End Date Taking? Authorizing Provider  docusate sodium (COLACE) 100 MG capsule Take 1 capsule (100 mg total) by mouth every 12 (twelve) hours. 10/09/16   Azalia Bilis, MD  doxycycline (VIBRA-TABS) 100 MG tablet Take 1 tablet (100 mg total) by mouth 2 (two) times daily. 10/07/16   Elvina Sidle, MD  HYDROcodone-acetaminophen (NORCO) 5-325 MG tablet Take 1 tablet by mouth every 6 (six) hours as needed for moderate pain. 10/07/16   Elvina Sidle, MD  oxyCODONE-acetaminophen (PERCOCET/ROXICET) 5-325 MG tablet Take 1 tablet by mouth every 4 (four) hours as needed for severe pain. 10/09/16   Azalia Bilis, MD    Family History No family history on file.  Social History Social History  Substance Use Topics  . Smoking status: Never Smoker  .  Smokeless tobacco: Never Used  . Alcohol use No     Allergies   Amoxicillin   Review of Systems Review of Systems  All other systems reviewed and are negative.    Physical Exam Updated Vital Signs BP (!) 106/58   Pulse 67   Temp 98 F (36.7 C) (Oral)   Resp 18   Ht  (1.778 m)   Wt 190 lb (86.2 kg)   SpO2 100%   BMI 27.26 kg/m   Physical Exam  Constitutional: He is oriented to person, place, and time. He appears well-developed and well-nourished.  HENT:  Head: Normocephalic.  Eyes: EOM are normal.  Neck: Normal range of motion.  Pulmonary/Chest: Effort normal.  Abdominal: He exhibits no distension.  Genitourinary:  Genitourinary Comments: Perianal cruciate incision noted with packing in place.  No significant drainage.  No surrounding signs of cellulitis.  No induration or fluctuance or crepitus   Musculoskeletal: Normal range of motion.  Neurological: He is alert and oriented to person, place, and time.  Psychiatric: He has a normal mood and affect.  Nursing note and vitals reviewed.    ED Treatments / Results  Labs (all labs ordered are listed, but only abnormal results are displayed) Labs Reviewed - No data to display  EKG  EKG Interpretation None       Radiology Ct Pelvis W Contrast  Result Date: 10/08/2016 CLINICAL DATA:  Rectal abscess.  Rectal pain. EXAM:  CT PELVIS WITH CONTRAST TECHNIQUE: Multidetector CT imaging of the pelvis was performed using the standard protocol following the bolus administration of intravenous contrast. CONTRAST:  ISOVUE-300 IOPAMIDOL (ISOVUE-300) INJECTION 61% COMPARISON:  03/08/2015 FINDINGS: Urinary Tract: Distal ureters are decompressed. Urinary bladder unremarkable. Bowel: Fluid collection noted posterior to the rectum measuring approximately 2.4 x 1.7 cm compatible with perirectal abscess. Visualized large and small bowel otherwise unremarkable. Vascular/Lymphatic: Iliac vessels are normal caliber. No  adenopathy. Reproductive:  No visible focal abnormality. Other:  No free fluid or free air. Musculoskeletal: No acute bony abnormality. IMPRESSION: Perirectal fluid collection measuring 2.4 x 1.7 cm compatible with perirectal abscess. Electronically Signed   By: Charlett Nose M.D.   On: 10/08/2016 09:12    Procedures Procedures (including critical care time)  Medications Ordered in ED Medications  oxyCODONE-acetaminophen (PERCOCET/ROXICET) 5-325 MG per tablet 2 tablet (2 tablets Oral Given 10/09/16 0429)     Initial Impression / Assessment and Plan / ED Course  I have reviewed the triage vital signs and the nursing notes.  Pertinent labs & imaging results that were available during my care of the patient were reviewed by me and considered in my medical decision making (see chart for details).     Patient is overall well-appearing.  I personally reviewed the patient's CT imaging from earlier in the day.  I do not think he needs additional operative management at this time.  I do not think he needs IV antibiotics and admission the hospital.  I think this is too early to say that this is treatment failure.  He did undergo incision and drainage today and had moderate purulent material from the abscess.  I've asked that he call general surgery office for follow-up.  Final Clinical Impressions(s) / ED Diagnoses   Final diagnoses:  Perianal abscess    New Prescriptions New Prescriptions   DOCUSATE SODIUM (COLACE) 100 MG CAPSULE    Take 1 capsule (100 mg total) by mouth every 12 (twelve) hours.   OXYCODONE-ACETAMINOPHEN (PERCOCET/ROXICET) 5-325 MG TABLET    Take 1 tablet by mouth every 4 (four) hours as needed for severe pain.     Azalia Bilis, MD 10/09/16 [phone]

## 2016-10-09 NOTE — ED Triage Notes (Signed)
Pt presents with c/o abscess to his buttocks. Pt was seen twice at [facility] yesterday for the same and had the area drained both visits but reports that the pain is not getting any better.

## 2018-03-16 ENCOUNTER — Ambulatory Visit (HOSPITAL_COMMUNITY)
Admission: EM | Admit: 2018-03-16 | Discharge: 2018-03-16 | Disposition: A | Discharge status: Home / Self Care | Payer: Managed Care, Other (non HMO) | Attending: Family Medicine | Admitting: Family Medicine

## 2018-03-16 ENCOUNTER — Encounter (HOSPITAL_COMMUNITY): Payer: Self-pay

## 2018-03-16 DIAGNOSIS — K6289 Other specified diseases of anus and rectum: Secondary | ICD-10-CM

## 2018-03-16 MED ORDER — POLYETHYLENE GLYCOL 3350 17 GM/SCOOP PO POWD: 17.0000 g | Freq: Every day | ORAL | 0 refills | Status: DC

## 2018-03-16 MED ORDER — DOXYCYCLINE HYCLATE 100 MG PO CAPS: 100.0000 mg | ORAL_CAPSULE | Freq: Two times a day (BID) | ORAL | 0 refills | Status: DC

## 2018-03-16 NOTE — ED Triage Notes (Signed)
Ptd presents with anal area abscess

## 2018-03-16 NOTE — Discharge Instructions (Addendum)
I am unable to locate a specific abscess tonight.  We will start antibiotics and stool softeners to help with symptoms here tonight.  Soak bottom in warm tub 1-2 times a day.  If worsening of symptoms please follow up in ER or with general surgery for further evaluation and treatment.

## 2018-03-16 NOTE — ED Provider Notes (Signed)
[facility]    CSN: [id number] Arrival date & time: 03/16/18  1841     History   Chief Complaint Chief Complaint  Patient presents with  . Abscess    anal    HPI Dale Carroll is a 22 y.o. male.   Mariana SingleDaquan presents with complaints of rectal pain which started approximately 2 days ago. Pain worse when having a BM. Minimal pain with sitting on his bottom. No fevers. Hx of perirectal abscess which required drainage in the past. States feels slightly similar. No blood in stool. Has been taking probiotics, otherwise no medications for symptoms. Last similar was 09/2016.    ROS per HPI.      History reviewed. No pertinent past medical history.  There are no active problems to display for this patient.   History reviewed. No pertinent surgical history.     Home Medications    Prior to Admission medications   Medication Sig Start Date End Date Taking? Authorizing Provider  doxycycline (VIBRAMYCIN) 100 MG capsule Take 1 capsule (100 mg total) by mouth 2 (two) times daily. 03/16/18   Linus MakoBurky, Natalie B, NP  polyethylene glycol powder (GLYCOLAX/MIRALAX) powder Take 17 g by mouth daily. 03/16/18   Georgetta HaberBurky, Natalie B, NP    Family History History reviewed. No pertinent family history.  Social History Social History   Tobacco Use  . Smoking status: Never Smoker  . Smokeless tobacco: Never Used  Substance Use Topics  . Alcohol use: No  . Drug use: No     Allergies   Amoxicillin   Review of Systems Review of Systems   Physical Exam Triage Vital Signs ED Triage Vitals  Enc Vitals Group     BP 03/16/18 1931 114/76     Pulse Rate 03/16/18 1931 72     Resp 03/16/18 1931 20     Temp 03/16/18 1931 (!) 97.5 F (36.4 C)     Temp Source 03/16/18 1931 Oral     SpO2 03/16/18 1931 98 %     Weight --      Height --      Head Circumference --      Peak Flow --      Pain Score 03/16/18 1929 6     Pain Loc --      Pain Edu? --      Excl. in GC? --    No data  found.  Updated Vital Signs BP 114/76 (BP Location: Left Arm)   Pulse 72   Temp (!) 97.5 F (36.4 C) (Oral)   Resp 20   SpO2 98%    Physical Exam  Constitutional: He is oriented to person, place, and time. He appears well-developed and well-nourished.  Cardiovascular: Normal rate and regular rhythm.  Pulmonary/Chest: Effort normal and breath sounds normal.  Genitourinary: Rectal exam shows tenderness. Rectal exam shows no external hemorrhoid, no internal hemorrhoid, no fissure, no mass and anal tone normal.  Genitourinary Comments: No visible or palpable area of tenderness to external rectum; mild tenderness to left side of rectum on internal digital rectal exam without any specific palpation mass, fluctuance or abscess; sitting on bottom throughout history without any apparent distress  Neurological: He is alert and oriented to person, place, and time.  Skin: Skin is warm and dry.     UC Treatments / Results  Labs (all labs ordered are listed, but only abnormal results are displayed) Labs Reviewed - No data to display  EKG None  Radiology No results  found.  Procedures Procedures (including critical care time)  Medications Ordered in UC Medications - No data to display  Initial Impression / Assessment and Plan / UC Course  I have reviewed the triage vital signs and the nursing notes.  Pertinent labs & imaging results that were available during my care of the patient were reviewed by me and considered in my medical decision making (see chart for details).     Concern for presence of perirectal abscess, unable to specifically locate here in urgent care. Afebrile. Mild point tenderness. Will start with antibiotics, warm soaks, will need to go to general surgery or er if worsening of symptoms to better locate abscess for drainage. Patient verbalized understanding and agreeable to plan.  Ambulatory out of clinic without difficulty.    Final Clinical Impressions(s) / UC  Diagnoses   Final diagnoses:  Rectal pain     Discharge Instructions     I am unable to locate a specific abscess tonight.  We will start antibiotics and stool softeners to help with symptoms here tonight.  Soak bottom in warm tub 1-2 times a day.  If worsening of symptoms please follow up in ER or with general surgery for further evaluation and treatment.    ED Prescriptions    Medication Sig Dispense Auth. Provider   doxycycline (VIBRAMYCIN) 100 MG capsule Take 1 capsule (100 mg total) by mouth 2 (two) times daily. 20 capsule Linus Mako B, NP   polyethylene glycol powder (GLYCOLAX/MIRALAX) powder Take 17 g by mouth daily. 255 g Georgetta Haber, NP     Controlled Substance Prescriptions [place] Controlled Substance Registry consulted? Not Applicable   Georgetta Haber, NP 03/16/18 2011

## 2018-05-07 ENCOUNTER — Encounter (HOSPITAL_COMMUNITY): Payer: Self-pay

## 2018-05-07 ENCOUNTER — Other Ambulatory Visit: Payer: Self-pay

## 2018-05-07 ENCOUNTER — Ambulatory Visit (HOSPITAL_COMMUNITY)
Admission: EM | Admit: 2018-05-07 | Discharge: 2018-05-07 | Disposition: A | Discharge status: Home / Self Care | Payer: Self-pay | Attending: Family Medicine | Admitting: Family Medicine

## 2018-05-07 DIAGNOSIS — Z880 Allergy status to penicillin: Secondary | ICD-10-CM | POA: Insufficient documentation

## 2018-05-07 DIAGNOSIS — Z8619 Personal history of other infectious and parasitic diseases: Secondary | ICD-10-CM | POA: Insufficient documentation

## 2018-05-07 DIAGNOSIS — Z202 Contact with and (suspected) exposure to infections with a predominantly sexual mode of transmission: Secondary | ICD-10-CM | POA: Insufficient documentation

## 2018-05-07 DIAGNOSIS — Z113 Encounter for screening for infections with a predominantly sexual mode of transmission: Secondary | ICD-10-CM

## 2018-05-07 DIAGNOSIS — R300 Dysuria: Secondary | ICD-10-CM

## 2018-05-07 MED ORDER — AZITHROMYCIN 250 MG PO TABS
1000.0000 mg | ORAL_TABLET | Freq: Once | ORAL | Status: AC
  Administered 2018-05-07: 1000 mg via ORAL

## 2018-05-07 MED ORDER — AZITHROMYCIN 250 MG PO TABS
ORAL_TABLET | ORAL | Status: AC
  Filled 2018-05-07: qty 4

## 2018-05-07 MED ORDER — CEFTRIAXONE SODIUM 250 MG IJ SOLR
250.0000 mg | Freq: Once | INTRAMUSCULAR | Status: AC
  Administered 2018-05-07: 250 mg via INTRAMUSCULAR

## 2018-05-07 MED ORDER — CEFTRIAXONE SODIUM 250 MG IJ SOLR
INTRAMUSCULAR | Status: AC
  Filled 2018-05-07: qty 250

## 2018-05-07 NOTE — ED Provider Notes (Signed)
[facility]   [id number] 05/07/18 Arrival Time: 1807  ASSESSMENT & PLAN:  1. Possible exposure to STD   High risk sexual activity.   Discharge Instructions     You have been given the following medications today for treatment of suspected gonorrhea and/or chlamydia:  cefTRIAXone (ROCEPHIN) injection 250 mg azithromycin (ZITHROMAX) tablet 1,000 mg  Even though we have treated you today, we have sent testing for sexually transmitted infections. We will notify you of any positive results once they are received. If required, we will prescribe any medications you might need.  Please refrain from all sexual activity for at least the next seven days.    Pending: Labs Reviewed  RPR  HIV ANTIBODY (ROUTINE TESTING W REFLEX)  URINE CYTOLOGY ANCILLARY ONLY   Will notify of any positive results. Instructed to refrain from sexual activity for at least seven days.  Reviewed expectations re: course of current medical issues. Questions answered. Outlined signs and symptoms indicating need for more acute intervention. Patient verbalized understanding. After Visit Summary given.   SUBJECTIVE:  Dale Carroll is a 22 y.o. male who reports possible exposure to gonorrhea; partner recently tested positive. Urinary symptoms: "a little burning sensation" with urination a few days ago; now resolved. No penile discharge. Afebrile. No abdominal or pelvic pain. No back pain. No n/v. No rashes or lesions. Sexually active with single male partner. OTC treatment: none.  History of treated syphilis.  ROS: As per HPI. All other systems negative.   OBJECTIVE:  Vitals:   05/07/18 1827 05/07/18 1828  BP:  116/66  Pulse:  63  Resp:  18  Temp:  97.8 F (36.6 C)  SpO2:  100%  Weight: 86.2 kg     General appearance: alert, cooperative, appears stated age and no distress Throat: lips, mucosa, and tongue normal; teeth and gums normal CV: RRR Lungs: unlabored respirations Back: no CVA  tenderness; FROM at waist Abdomen: soft, non-tender GU: declined Skin: warm and dry Psychological: alert and cooperative; normal mood and affect.  Labs Reviewed  RPR  HIV ANTIBODY (ROUTINE TESTING W REFLEX)  URINE CYTOLOGY ANCILLARY ONLY    Allergies  Allergen Reactions  . Amoxicillin Rash   PMH: Rectal pain.  Social History   Socioeconomic History  . Marital status: Single    Spouse name: Not on file  . Number of children: Not on file  . Years of education: Not on file  . Highest education level: Not on file  Occupational History  . Not on file  Social Needs  . Financial resource strain: Not on file  . Food insecurity:    Worry: Not on file    Inability: Not on file  . Transportation needs:    Medical: Not on file    Non-medical: Not on file  Tobacco Use  . Smoking status: Never Smoker  . Smokeless tobacco: Never Used  Substance and Sexual Activity  . Alcohol use: No  . Drug use: No  . Sexual activity: Yes    Birth control/protection: Condom  Lifestyle  . Physical activity:    Days per week: Not on file    Minutes per session: Not on file  . Stress: Not on file  Relationships  . Social connections:    Talks on phone: Not on file    Gets together: Not on file    Attends religious service: Not on file    Active member of club or organization: Not on file    Attends meetings of  clubs or organizations: Not on file    Relationship status: Not on file  . Intimate partner violence:    Fear of current or ex partner: Not on file    Emotionally abused: Not on file    Physically abused: Not on file    Forced sexual activity: Not on file  Other Topics Concern  . Not on file  Social History Narrative  . Not on file         Mardella Layman, MD 05/09/18 [phone]

## 2018-05-07 NOTE — Discharge Instructions (Addendum)

## 2018-05-07 NOTE — ED Triage Notes (Signed)
Pt cc   He would like to be tested for STDs because his partner stated that she was treated for a STD.

## 2018-05-08 LAB — URINE CYTOLOGY ANCILLARY ONLY
Chlamydia: NEGATIVE
Neisseria Gonorrhea: NEGATIVE
Trichomonas: NEGATIVE

## 2018-05-08 LAB — HIV ANTIBODY (ROUTINE TESTING W REFLEX): HIV Screen 4th Generation wRfx: NONREACTIVE

## 2018-05-09 LAB — RPR, QUANT+TP ABS (REFLEX): T Pallidum Abs: POSITIVE — AB

## 2018-05-09 LAB — RPR: RPR: REACTIVE — AB

## 2018-05-10 ENCOUNTER — Telehealth (HOSPITAL_COMMUNITY): Payer: Self-pay

## 2018-05-10 NOTE — Telephone Encounter (Signed)
Called and left message for pt to return call.  

## 2018-05-11 ENCOUNTER — Telehealth (HOSPITAL_COMMUNITY): Payer: Self-pay | Admitting: Emergency Medicine

## 2018-05-11 NOTE — Telephone Encounter (Signed)
Syphilis is positive. Pt needs to return for treatment. Pt needs education to please refrain from sexual intercourse for 7 days to give the medicine time to work, sexual partners need to be notified and tested/treated.  Condoms may reduce risk of reinfection.  Recheck or followup with PCP for further evaluation if symptoms are not improving.   [facility] notified.  Attempted to reach patient x2. No answer at this time. Voicemail left.

## 2018-05-11 NOTE — Telephone Encounter (Signed)
Pt called and made aware of test results. Patient will come back for treatment.

## 2018-08-17 ENCOUNTER — Ambulatory Visit
Admission: EM | Admit: 2018-08-17 | Discharge: 2018-08-17 | Disposition: A | Discharge status: Home / Self Care | Payer: Self-pay | Attending: Emergency Medicine | Admitting: Emergency Medicine

## 2018-08-17 ENCOUNTER — Encounter: Payer: Self-pay | Admitting: Emergency Medicine

## 2018-08-17 DIAGNOSIS — L538 Other specified erythematous conditions: Secondary | ICD-10-CM

## 2018-08-17 DIAGNOSIS — H6122 Impacted cerumen, left ear: Secondary | ICD-10-CM

## 2018-08-17 MED ORDER — CEFDINIR 300 MG PO CAPS: 300.0000 mg | ORAL_CAPSULE | Freq: Two times a day (BID) | ORAL | 0 refills | Status: AC

## 2018-08-17 NOTE — Discharge Instructions (Signed)
Begin omnicef twice daily to help with infection May use debrox over the counter to prevent and help with wax build up in the furture

## 2018-08-17 NOTE — ED Notes (Signed)
Patient able to ambulate independently  

## 2018-08-17 NOTE — ED Triage Notes (Signed)
PT presents to [facility] for assessment of left ear pain x 3 days, with loss of hearing in the mornings.

## 2018-08-18 NOTE — ED Provider Notes (Addendum)
[facility]    CSN: [id number] Arrival date & time: 08/17/18  1943     History   Chief Complaint Chief Complaint  Patient presents with  . Otalgia    HPI Dale Carroll is a 23 y.o. male no significant past medical history presenting today for evaluation of left ear pain and decreased hearing.  Patient states that over the past few days he has had decreased hearing in his left ear.  He is also had some associated mild discomfort.  Denies any drainage.  Denies associated URI symptoms of congestion, cough or sore throat.  Denies any fevers.  He does admit to using Q-tips, has not tried anything else to relieve his symptoms.  HPI  History reviewed. No pertinent past medical history.  There are no active problems to display for this patient.   History reviewed. No pertinent surgical history.     Home Medications    Prior to Admission medications   Medication Sig Start Date End Date Taking? Authorizing Provider  cefdinir (OMNICEF) 300 MG capsule Take 1 capsule (300 mg total) by mouth 2 (two) times daily for 7 days. 08/17/18 08/24/18  Wieters, Junius Creamer, PA-C    Family History History reviewed. No pertinent family history.  Social History Social History   Tobacco Use  . Smoking status: Never Smoker  . Smokeless tobacco: Never Used  Substance Use Topics  . Alcohol use: No  . Drug use: No     Allergies   Amoxicillin   Review of Systems Review of Systems  Constitutional: Negative for activity change, appetite change, chills, fatigue and fever.  HENT: Positive for ear pain and hearing loss. Negative for congestion, rhinorrhea, sinus pressure, sore throat and trouble swallowing.   Eyes: Negative for discharge and redness.  Respiratory: Negative for cough, chest tightness and shortness of breath.   Cardiovascular: Negative for chest pain.  Gastrointestinal: Negative for abdominal pain, diarrhea, nausea and vomiting.  Musculoskeletal: Negative for myalgias.    Skin: Negative for rash.  Neurological: Negative for dizziness, light-headedness and headaches.     Physical Exam Triage Vital Signs ED Triage Vitals [08/17/18 1954]  Enc Vitals Group     BP 133/80     Pulse Rate 60     Resp 18     Temp 97.6 F (36.4 C)     Temp Source Oral     SpO2 99 %     Weight      Height      Head Circumference      Peak Flow      Pain Score 7     Pain Loc      Pain Edu?      Excl. in GC?    No data found.  Updated Vital Signs BP 133/80 (BP Location: Left Arm)   Pulse 60   Temp 97.6 F (36.4 C) (Oral)   Resp 18   SpO2 99%   Visual Acuity Right Eye Distance:   Left Eye Distance:   Bilateral Distance:    Right Eye Near:   Left Eye Near:    Bilateral Near:     Physical Exam Vitals signs and nursing note reviewed.  Constitutional:      Appearance: He is well-developed.  HENT:     Head: Normocephalic and atraumatic.     Ears:     Comments: Left TM with cerumen impaction, after removal canal appeared slightly erythematous, TM also slightly erythematous and opaque, dull  Right TM nonerythematous  with good cone of light    Nose:     Comments: Nasal mucosa pink, no rhinorrhea present    Mouth/Throat:     Comments: Oral mucosa pink and moist, no tonsillar enlargement or exudate. Posterior pharynx patent and nonerythematous, no uvula deviation or swelling. Normal phonation. Eyes:     Conjunctiva/sclera: Conjunctivae normal.  Neck:     Musculoskeletal: Neck supple.  Cardiovascular:     Rate and Rhythm: Normal rate and regular rhythm.     Heart sounds: No murmur.  Pulmonary:     Effort: Pulmonary effort is normal. No respiratory distress.     Breath sounds: Normal breath sounds.  Abdominal:     Palpations: Abdomen is soft.     Tenderness: There is no abdominal tenderness.  Skin:    General: Skin is warm and dry.  Neurological:     Mental Status: He is alert.      UC Treatments / Results  Labs (all labs ordered are listed,  but only abnormal results are displayed) Labs Reviewed - No data to display  EKG None  Radiology No results found.  Procedures Procedures (including critical care time) Ear lavage performed by nursing staff. Medications Ordered in UC Medications - No data to display  Initial Impression / Assessment and Plan / UC Course  I have reviewed the triage vital signs and the nursing notes.  Pertinent labs & imaging results that were available during my care of the patient were reviewed by me and considered in my medical decision making (see chart for details).     Cerumen impaction removed via lavage by nursing staff with improvement in hearing and discomfort, discussed avoiding use of Q-tips in future, may try Debrox instead.  Given appearance of TM and canal after removal, opted to place on Omnicef twice daily x1 week to treat any underlying infection contributing to discomfort.Discussed strict return precautions. Patient verbalized understanding and is agreeable with plan.  Final Clinical Impressions(s) / UC Diagnoses   Final diagnoses:  Impacted cerumen of left ear     Discharge Instructions     Begin omnicef twice daily to help with infection May use debrox over the counter to prevent and help with wax build up in the furture   ED Prescriptions    Medication Sig Dispense Auth. Provider   cefdinir (OMNICEF) 300 MG capsule Take 1 capsule (300 mg total) by mouth 2 (two) times daily for 7 days. 14 capsule Wieters, Hallie C, PA-C     Controlled Substance Prescriptions [place] Controlled Substance Registry consulted? Not Applicable   Foy [place] 08/18/18 0759    Lew Dawes, PA-C 09/04/18 1304    Wieters, [place] C, [place] 09/04/18 1338

## 2018-08-28 ENCOUNTER — Ambulatory Visit
Admission: EM | Admit: 2018-08-28 | Discharge: 2018-08-28 | Disposition: A | Discharge status: Home / Self Care | Payer: Self-pay | Attending: Family Medicine | Admitting: Family Medicine

## 2018-08-28 DIAGNOSIS — R140 Abdominal distension (gaseous): Secondary | ICD-10-CM

## 2018-08-28 DIAGNOSIS — T161XXA Foreign body in right ear, initial encounter: Secondary | ICD-10-CM

## 2018-08-28 NOTE — Discharge Instructions (Signed)
No Q Tips.  OTC Debrox if needed for wax.  Low FODMAP diet for gas and bloating.  Take care  Dr. Adriana Simas

## 2018-08-28 NOTE — ED Provider Notes (Signed)
[facility]    CSN: [id number] Arrival date & time: 08/28/18  1802  History   Chief Complaint Chief Complaint  Patient presents with  . Ear Fullness   HPI  23 year old male presents with the above complaints.  Patient states that he was using a Q-tip to clean out his right ear.  He believes that he may have gotten a portion of the cotton tip stuck in his ear.  He reports ear fullness.  No drainage from the ear.  No fever.  No other associated symptoms.  Additionally, patient states that he often has abdominal bloating/fullness.  He would like to discuss potential interventions regarding this.  No reports of diarrhea.  No constipation.  Has regular bowel movements without difficulty.  No changes in his dietary habits.  No reports of abdominal pain.  No interventions tried.  No other complaints.  History reviewed as below. PMH: Perirectal abscess, syphilis, anxiety  Home Medications    Prior to Admission medications   Not on File   Social History Social History   Tobacco Use  . Smoking status: Never Smoker  . Smokeless tobacco: Never Used  Substance Use Topics  . Alcohol use: No  . Drug use: No   Allergies   Amoxicillin   Review of Systems Review of Systems  HENT:       Possible foreign body in ear (R).  Gastrointestinal:       Bloating.   Physical Exam Triage Vital Signs ED Triage Vitals  Enc Vitals Group     BP 08/28/18 1812 131/79     Pulse Rate 08/28/18 1812 75     Resp 08/28/18 1812 16     Temp 08/28/18 1812 98.3 F (36.8 C)     Temp Source 08/28/18 1812 Oral     SpO2 08/28/18 1812 97 %     Weight --      Height --      Head Circumference --      Peak Flow --      Pain Score 08/28/18 1814 0     Pain Loc --      Pain Edu? --      Excl. in GC? --    Updated Vital Signs BP 131/79 (BP Location: Left Arm)   Pulse 75   Temp 98.3 F (36.8 C) (Oral)   Resp 16   SpO2 97%   Visual Acuity Right Eye Distance:   Left Eye Distance:     Bilateral Distance:    Right Eye Near:   Left Eye Near:    Bilateral Near:     Physical Exam Vitals signs and nursing note reviewed.  Constitutional:      General: He is not in acute distress.    Appearance: Normal appearance.  HENT:     Head: Normocephalic and atraumatic.     Right Ear: Tympanic membrane and ear canal normal.     Left Ear: Tympanic membrane and ear canal normal.     Ears:     Comments: Cotton was removed from patients right ear by nurse prior to my evaluation. Eyes:     General:        Right eye: No discharge.        Left eye: No discharge.     Conjunctiva/sclera: Conjunctivae normal.  Cardiovascular:     Rate and Rhythm: Normal rate and regular rhythm.  Pulmonary:     Effort: Pulmonary effort is normal.     Breath sounds:  Normal breath sounds.  Abdominal:     General: There is no distension.     Palpations: Abdomen is soft.     Tenderness: There is no abdominal tenderness.  Neurological:     Mental Status: He is alert.  Psychiatric:        Mood and Affect: Mood normal.        Behavior: Behavior normal.    UC Treatments / Results  Labs (all labs ordered are listed, but only abnormal results are displayed) Labs Reviewed - No data to display  EKG None  Radiology No results found.  Procedures Procedures (including critical care time)  Medications Ordered in UC Medications - No data to display  Initial Impression / Assessment and Plan / UC Course  I have reviewed the triage vital signs and the nursing notes.  Pertinent labs & imaging results that were available during my care of the patient were reviewed by me and considered in my medical decision making (see chart for details).    23 year old male presents with a foreign body in his right ear.  Was removed by nursing staff without difficulty.  Regarding the patient's bloating, I advised a low FODMAP diet. Information given.   Final Clinical Impressions(s) / UC Diagnoses   Final  diagnoses:  Foreign body of right ear, initial encounter  Bloating     Discharge Instructions     No Q Tips.  OTC Debrox if needed for wax.  Low FODMAP diet for gas and bloating.  Take care  Dr. Adriana Simas    ED Prescriptions    None     Controlled Substance Prescriptions [place] Controlled Substance Registry consulted? Not Applicable   Tommie Sams, DO 08/28/18 1857

## 2018-08-28 NOTE — ED Triage Notes (Signed)
Pt states he think he has cotton from a qtip in his rt ear. States also has abdominal bloating where he feels full all the time, states hx of same.

## 2018-09-04 IMAGING — CT CT PELVIS W/ CM
2 of 3 series · 14 of 46 positions shown, 16 images · IV contrast (iopamidol)
Comparison: 03/08/2015

CLINICAL DATA: Rectal abscess.  Rectal pain.

EXAM:
CT PELVIS WITH CONTRAST
TECHNIQUE: Multidetector CT imaging of the pelvis was performed using the
standard protocol following the bolus administration of intravenous
contrast.
CONTRAST:  100mL [id number]-[id number] IOPAMIDOL ([id number]-[id number]) INJECTION 61%

[Series 201: routine, idose (2) · axial · 0.83mm/px · z∈[+58,+293]mm · 11 of 55 slices shown, 13 images]
[im 4/55  soft-tissue]
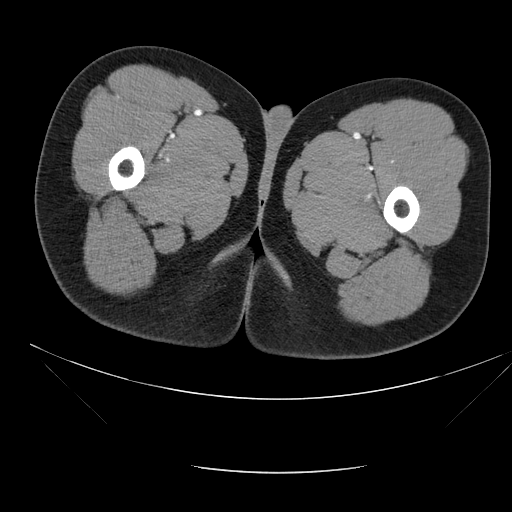
[im 4/55  bone]
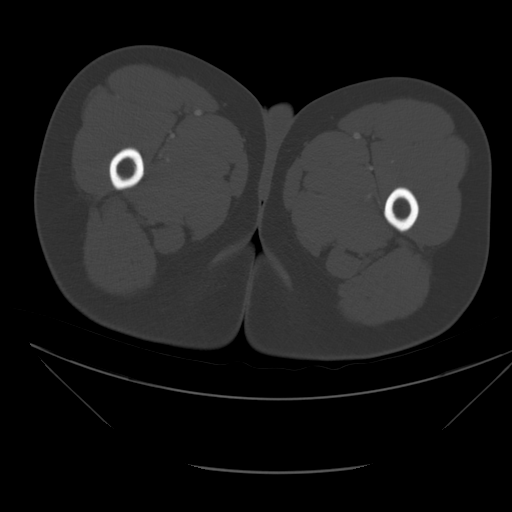
[im 9/55  soft-tissue]
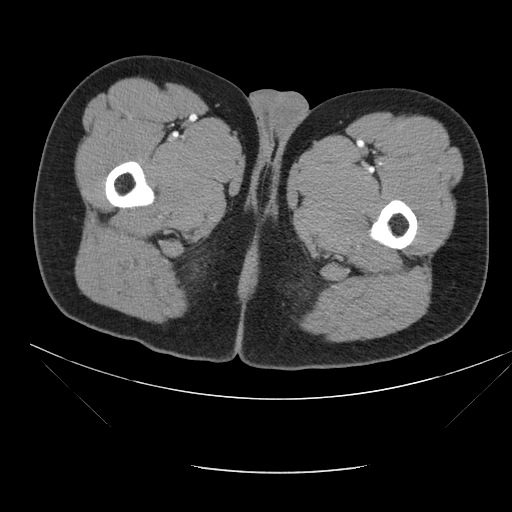
[im 13/55  soft-tissue]
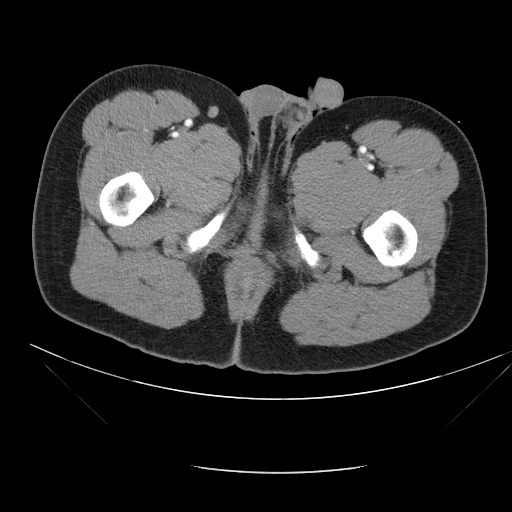
[im 18/55  soft-tissue]
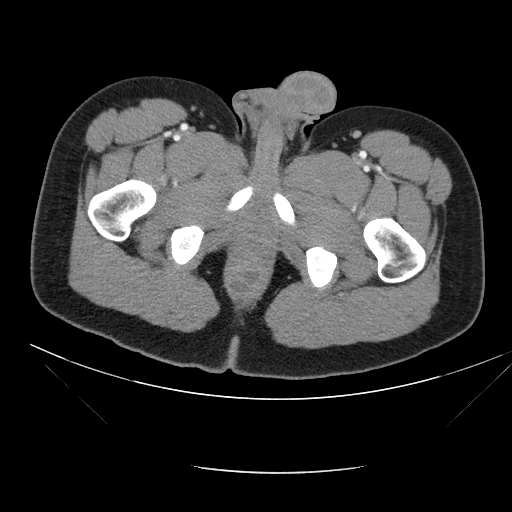
[im 23/55  soft-tissue]
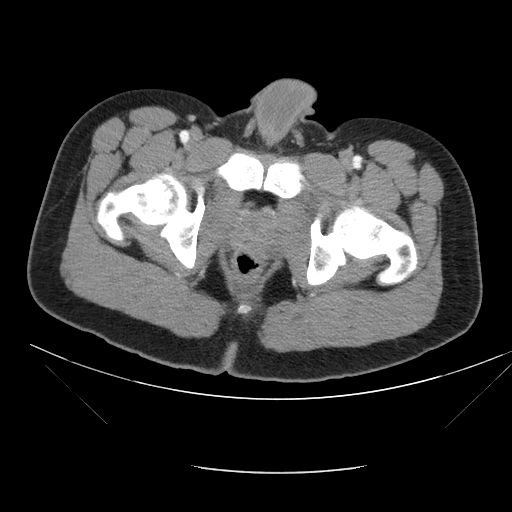
[im 28/55  soft-tissue]
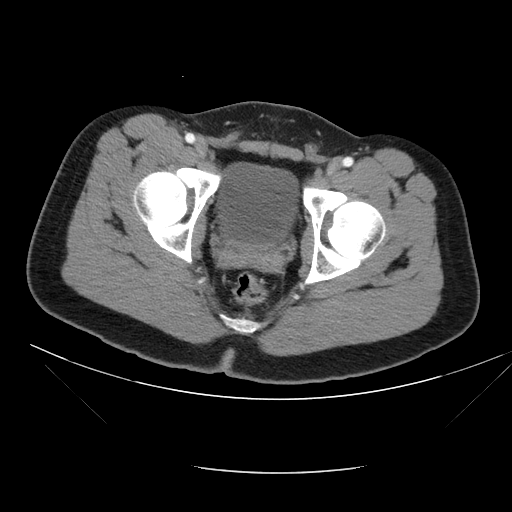
[im 32/55  soft-tissue]
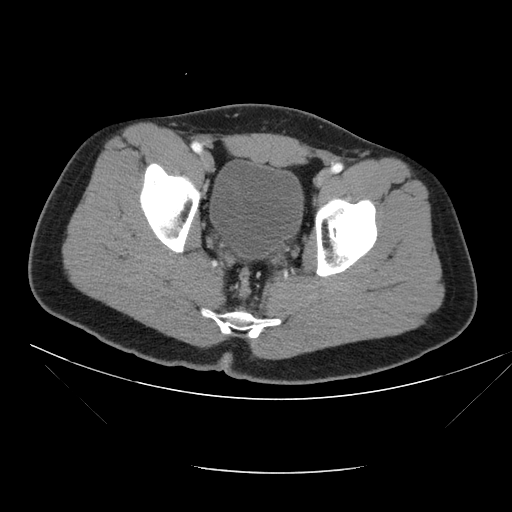
[im 37/55  soft-tissue]
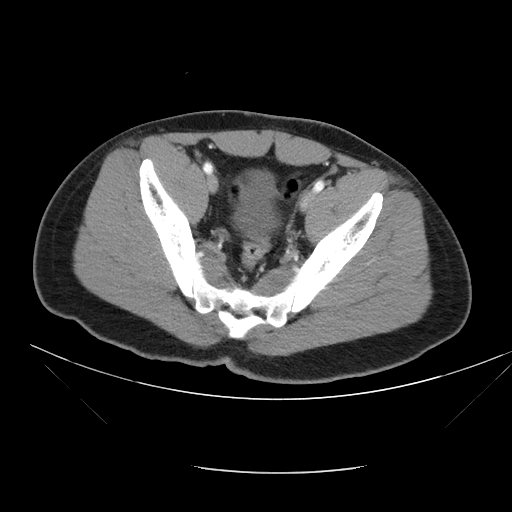
[im 42/55  soft-tissue]
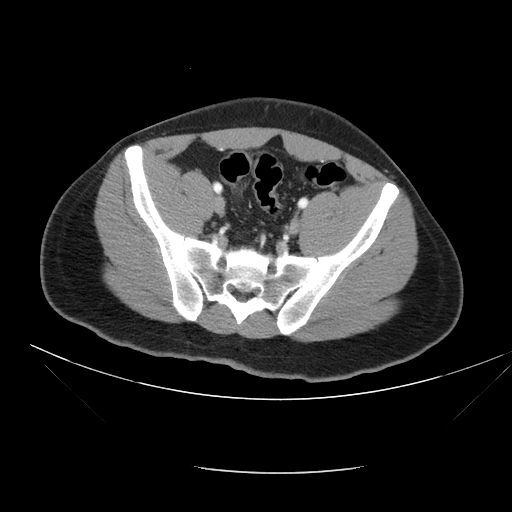
[im 42/55  bone]
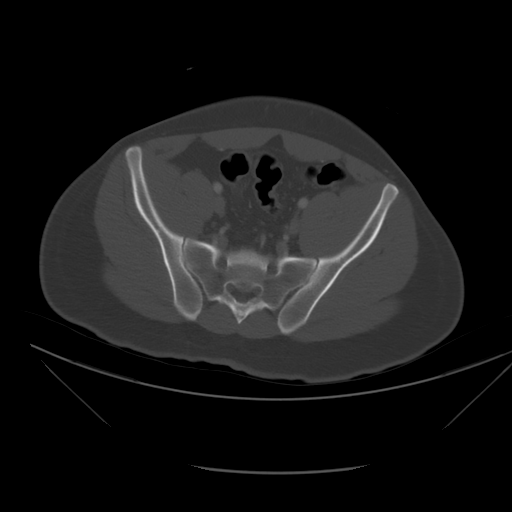
[im 46/55  soft-tissue]
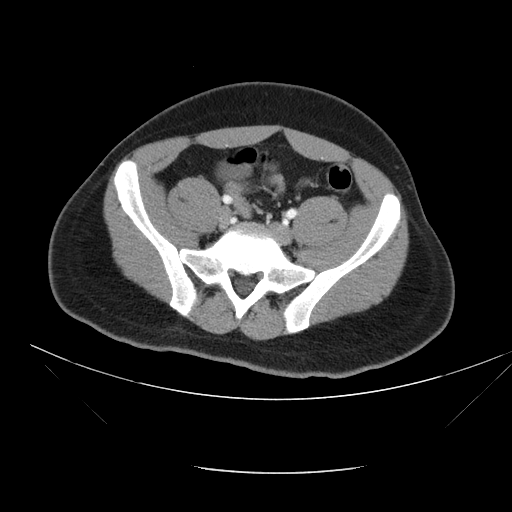
[im 51/55  soft-tissue]
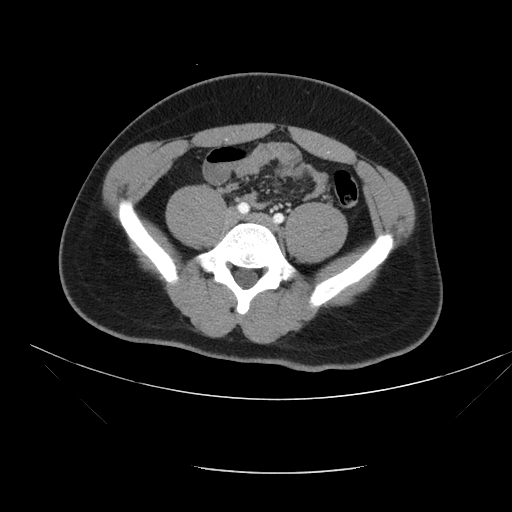

[Series 203: coronals, idose (2) · coronal · 0.45mm/px · 3 of 106 slices shown]
[im 36/106  soft-tissue]
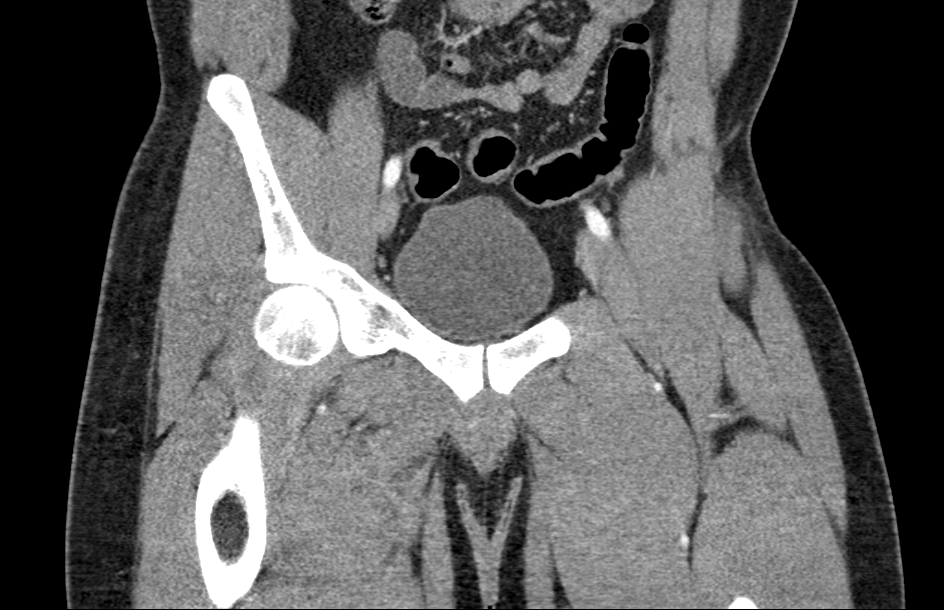
[im 47/106  soft-tissue]
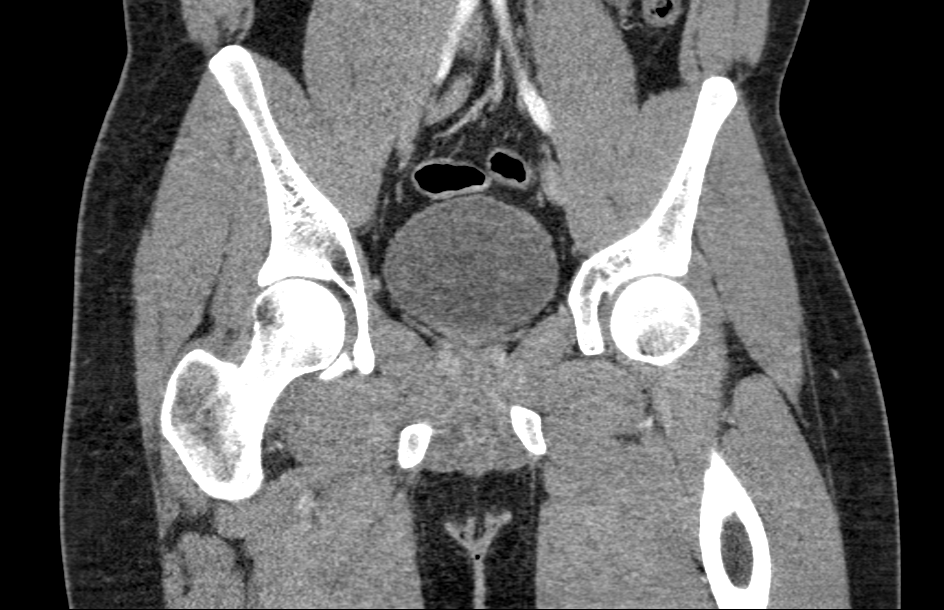
[im 59/106  soft-tissue]
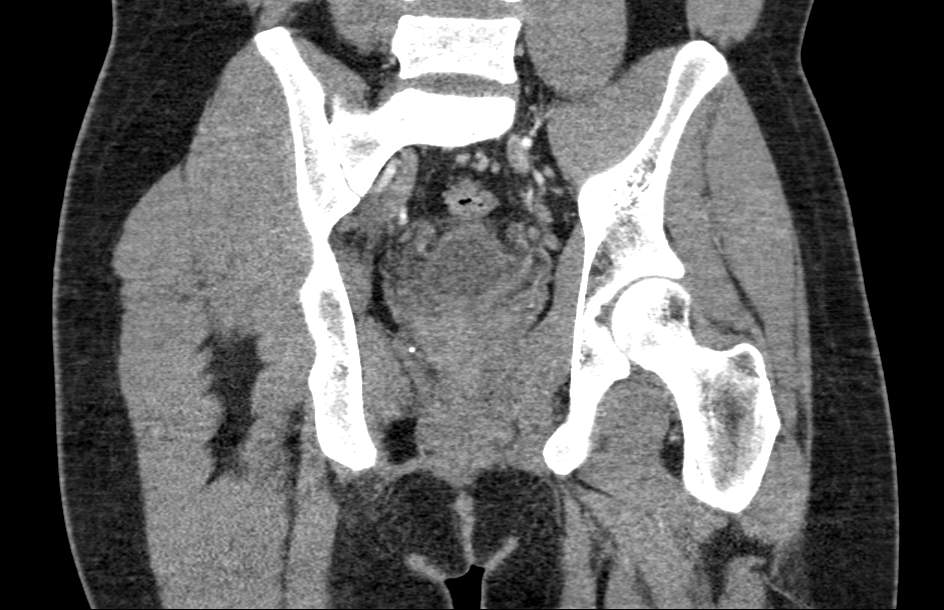

[14 of 46 positions shown; findings below may reference images not displayed]

FINDINGS: Urinary Tract: Distal ureters are decompressed. Urinary bladder
unremarkable.

Bowel: Fluid collection noted posterior to the rectum measuring
approximately 2.4 x 1.7 cm compatible with perirectal abscess.
Visualized large and small bowel otherwise unremarkable.

Vascular/Lymphatic: Iliac vessels are normal caliber. No adenopathy.

Reproductive:  No visible focal abnormality.

Other:  No free fluid or free air.

Musculoskeletal: No acute bony abnormality.
IMPRESSION: Perirectal fluid collection measuring 2.4 x 1.7 cm compatible with
perirectal abscess.
